# Patient Record
Sex: Female | Born: 1965
Health system: Southern US, Community
[De-identification: ages and names within clinical notes are randomized; demographics above are authoritative.]

## PROBLEM LIST (undated history)

## (undated) DIAGNOSIS — F32A Depression, unspecified: Secondary | ICD-10-CM

## (undated) DIAGNOSIS — T7840XA Allergy, unspecified, initial encounter: Secondary | ICD-10-CM

## (undated) DIAGNOSIS — Z1371 Encounter for nonprocreative screening for genetic disease carrier status: Secondary | ICD-10-CM

## (undated) DIAGNOSIS — K219 Gastro-esophageal reflux disease without esophagitis: Secondary | ICD-10-CM

## (undated) DIAGNOSIS — F329 Major depressive disorder, single episode, unspecified: Secondary | ICD-10-CM

## (undated) DIAGNOSIS — M4310 Spondylolisthesis, site unspecified: Secondary | ICD-10-CM

## (undated) DIAGNOSIS — F419 Anxiety disorder, unspecified: Secondary | ICD-10-CM

## (undated) HISTORY — PX: CHOLECYSTECTOMY: SHX55

## (undated) HISTORY — DX: Spondylolisthesis, site unspecified: M43.10

## (undated) HISTORY — PX: ABDOMINAL HYSTERECTOMY: SHX81

## (undated) HISTORY — DX: Depression, unspecified: F32.A

## (undated) HISTORY — DX: Major depressive disorder, single episode, unspecified: F32.9

## (undated) HISTORY — DX: Gastro-esophageal reflux disease without esophagitis: K21.9

## (undated) HISTORY — PX: LAPAROSCOPIC GASTRIC SLEEVE RESECTION: SHX5895

## (undated) HISTORY — DX: Encounter for nonprocreative screening for genetic disease carrier status: Z13.71

## (undated) HISTORY — DX: Allergy, unspecified, initial encounter: T78.40XA

---

## 2014-10-10 DIAGNOSIS — F419 Anxiety disorder, unspecified: Secondary | ICD-10-CM | POA: Insufficient documentation

## 2014-10-10 DIAGNOSIS — Z803 Family history of malignant neoplasm of breast: Secondary | ICD-10-CM | POA: Insufficient documentation

## 2014-10-10 DIAGNOSIS — Z90710 Acquired absence of both cervix and uterus: Secondary | ICD-10-CM | POA: Insufficient documentation

## 2014-10-10 DIAGNOSIS — J3081 Allergic rhinitis due to animal (cat) (dog) hair and dander: Secondary | ICD-10-CM | POA: Insufficient documentation

## 2015-10-27 ENCOUNTER — Ambulatory Visit
Admission: EM | Admit: 2015-10-27 | Discharge: 2015-10-27 | Disposition: A | Discharge status: Home / Self Care | Payer: BLUE CROSS/BLUE SHIELD | Attending: Emergency Medicine | Admitting: Emergency Medicine

## 2015-10-27 DIAGNOSIS — R1010 Upper abdominal pain, unspecified: Secondary | ICD-10-CM

## 2015-10-27 DIAGNOSIS — R079 Chest pain, unspecified: Secondary | ICD-10-CM | POA: Diagnosis present

## 2015-10-27 HISTORY — DX: Anxiety disorder, unspecified: F41.9

## 2015-10-27 HISTORY — DX: Hemochromatosis, unspecified: E83.119

## 2015-10-27 MED ORDER — ACETAMINOPHEN 500 MG PO TABS
1000.0000 mg | ORAL_TABLET | Freq: Once | ORAL | Status: AC
  Administered 2015-10-27: 1000 mg via ORAL

## 2015-10-27 NOTE — Discharge Instructions (Signed)
Go to the [facility] emergency room to rule out serious causes of your abdominal pain.

## 2015-10-27 NOTE — ED Provider Notes (Signed)
HPI  SUBJECTIVE:   Morgan Hobbs is a 49 y.o. female who presents with subxiphoid, epigastric pain for the past 5 days. She reports it as constant, sharp, tenderness, states that it feels as if "someone is punching me". She states that the pain radiates to her back and into her left shoulder. She states that the pain is worse with eating, drinking, belching, deep inspiration, palpation, movement. It is slightly better with lying on her side. She has tried Gas-X, Tums, omeprazole. She reports decreased by mouth intake due to the pain. She had a liver biopsy for hemochromatosis 4 days ago, but was having the abdominal pain prior to that. Saturday she started having diarrhea several episodes today, after eating. She reports sensation of abdominal distention, but denies nausea, vomiting, fevers, coughing, wheezing, shortness of breath. She also reports substernal intermittent, seconds long nonradiating chest pain that she describes as having a "panic attack" for several days. She has had similar chest pain before. She denies diaphoresis, palpitations, presyncope, syncope. There is no positional, exertional, pleuritic component to this chest pain. Past medical history status post cholecystectomy, gastric sleeve for weight loss, hiatal hernia repair, hysterectomy,  hemochromatosis, GERD. She denies history of pancreatitis, alcohol abuse, peptic ulcer disease, gastric ulcers, atrial fibrillation, mesenteric ischemia, MI, coronary artery disease, hypercholesterolemia, diabetes, hypertension. Family history negative for early MI.   Past Medical History  Diagnosis Date  . Hemochromatosis   . Anxiety     Past Surgical History  Procedure Laterality Date  . Cholecystectomy    . Abdominal hysterectomy    . Laparoscopic gastric sleeve resection      Family History  Problem Relation Age of Onset  . Cancer Mother   . Lupus Father   . Cancer Sister     Social History  Substance Use Topics  . Smoking  status: Never Smoker   . Smokeless tobacco: None  . Alcohol Use: Yes     Comment: rarely    No current facility-administered medications for this encounter.  Current outpatient prescriptions:  .  acetaminophen (TYLENOL) 650 MG CR tablet, Take 650 mg by mouth every 8 (eight) hours as needed for pain., Disp: , Rfl:  .  FLUoxetine (PROZAC) 20 MG capsule, Take 20 mg by mouth daily., Disp: , Rfl:  .  gabapentin (NEURONTIN) 600 MG tablet, Take 600 mg by mouth 1 day or 1 dose., Disp: , Rfl:  .  loratadine (CLARITIN) 10 MG tablet, Take 10 mg by mouth daily., Disp: , Rfl:  .  Melatonin 3 MG TABS, Take by mouth., Disp: , Rfl:  .  Multiple Vitamin (MULTIVITAMIN) capsule, Take 1 capsule by mouth daily., Disp: , Rfl:   Allergies  Allergen Reactions  . Codeine Shortness Of Breath  . Decadron [Dexamethasone] Other (See Comments)    Hallucinations  . Erythromycin Hives  . Penicillins Rash     ROS  As noted in HPI.   Physical Exam  BP 96/62 mmHg  Pulse 72  Temp(Src) 98.7 F (37.1 C) (Tympanic)  Resp 16  Ht 5\' 8"  (1.727 m)  Wt 201 lb (91.173 kg)  BMI 30.57 kg/m2  SpO2 100%  Constitutional: Well developed, well nourished, appears uncomfortable Eyes: PERRL, EOMI, conjunctiva normal bilaterally HENT: Normocephalic, atraumatic,mucus membranes moist Respiratory: Clear to auscultation bilaterally, no rales, no wheezing, no rhonchi Cardiovascular: Normal rate and rhythm, no murmurs, no gallops, no rubs GI: Soft, midline gastric/subxiphoid bruising with healing surgical incision. + upper abdominal tenderness including left upper quadrant, right upper quadrant.  no guarding. + rebound.  nondistended, normal bowel sounds.  Back: no CVAT skin: No rash, skin intact Musculoskeletal: No edema, no tenderness, no deformities Neurologic: Alert & oriented x 3, CN II-XII grossly intact, no motor deficits, sensation grossly intact Psychiatric: Speech and behavior appropriate   ED  Course   Medications  acetaminophen (TYLENOL) tablet 1,000 mg (1,000 mg Oral Given 10/27/15 1650)    Orders Placed This Encounter  Procedures  . ED EKG    Standing Status: Standing     Number of Occurrences: 1     Standing Expiration Date:     Order Specific Question:  Reason for Exam    Answer:  Chest Pain  . EKG 12-Lead    Standing Status: Standing     Number of Occurrences: 1     Standing Expiration Date:    No results found for this or any previous visit (from the past 24 hour(s)). No results found.  ED Clinical Impression  Pain of upper abdomen  ED Assessment/Plan  Patient declined nausea medication. Requested Tylenol.   EKG normal sinus rhythm, rate 63 normal axis, normal intervals. No hypertrophy. No ST-T wave changes. No previous EKG for comparision.   Expected right upper quadrant tenderness given recent biopsy, but has significant subxiphoid and left upper quadrant tenderness.  transferring to the ER to rule out perforation or other abdominal emergency. Vitals are acceptable. EKG is normal. feel the patient is safe to go via private vehicle. Patient has decided to go to [facility] since she has received most of her care there. Discussed the rationale for transfer. Patient agrees with plan.  *This clinic note was created using Dragon dictation software. Therefore, there may be occasional mistakes despite careful proofreading.  ?  Domenick Gong, MD 10/28/15 1236

## 2015-10-27 NOTE — ED Notes (Signed)
States started last Thursday with epigastric pain, headache and diarrhea started Saturday. Recently Dx with hemachromatosis(sp?) and [facility]. She states she told them her symptoms and they were not concerned. Continues with epigastric pain and has not called PMD

## 2017-02-08 DIAGNOSIS — Z9884 Bariatric surgery status: Secondary | ICD-10-CM | POA: Insufficient documentation

## 2017-02-09 DIAGNOSIS — E669 Obesity, unspecified: Secondary | ICD-10-CM | POA: Insufficient documentation

## 2017-08-31 ENCOUNTER — Ambulatory Visit
Admission: EM | Admit: 2017-08-31 | Discharge: 2017-08-31 | Disposition: A | Discharge status: Home / Self Care | Payer: BLUE CROSS/BLUE SHIELD | Attending: Family Medicine | Admitting: Family Medicine

## 2017-08-31 ENCOUNTER — Encounter: Payer: Self-pay | Admitting: *Deleted

## 2017-08-31 DIAGNOSIS — R5383 Other fatigue: Secondary | ICD-10-CM | POA: Diagnosis not present

## 2017-08-31 DIAGNOSIS — J029 Acute pharyngitis, unspecified: Secondary | ICD-10-CM

## 2017-08-31 DIAGNOSIS — R05 Cough: Secondary | ICD-10-CM

## 2017-08-31 DIAGNOSIS — J111 Influenza due to unidentified influenza virus with other respiratory manifestations: Secondary | ICD-10-CM

## 2017-08-31 DIAGNOSIS — R51 Headache: Secondary | ICD-10-CM | POA: Diagnosis not present

## 2017-08-31 DIAGNOSIS — R69 Illness, unspecified: Secondary | ICD-10-CM

## 2017-08-31 LAB — RAPID STREP SCREEN (MED CTR MEBANE ONLY): STREPTOCOCCUS, GROUP A SCREEN (DIRECT): NEGATIVE

## 2017-08-31 MED ORDER — OSELTAMIVIR PHOSPHATE 75 MG PO CAPS: 75.0000 mg | ORAL_CAPSULE | Freq: Two times a day (BID) | ORAL | 0 refills | Status: DC

## 2017-08-31 MED ORDER — KETOROLAC TROMETHAMINE 60 MG/2ML IM SOLN
60.0000 mg | Freq: Once | INTRAMUSCULAR | Status: AC
  Administered 2017-08-31: 60 mg via INTRAMUSCULAR

## 2017-08-31 NOTE — Discharge Instructions (Signed)
Rest, fluids, over the counter tylenol as needed °

## 2017-08-31 NOTE — ED Provider Notes (Signed)
[facility]    CSN: [id number] Arrival date & time: 08/31/17  1619     History   Chief Complaint Chief Complaint  Patient presents with  . Cough  . Generalized Body Aches  . Headache  . Chills  . Nausea  . Nasal Congestion    HPI Morgan Hobbs is a 51 y.o. female.   The history is provided by the patient.  Cough  Associated symptoms: fever, headaches, myalgias, rhinorrhea and sore throat   Associated symptoms: no wheezing   Headache  Associated symptoms: congestion, cough, fatigue, fever, myalgias, sore throat and URI   URI  Presenting symptoms: congestion, cough, fatigue, fever, rhinorrhea and sore throat   Severity:  Moderate Onset quality:  Sudden Duration:  1 day Timing:  Constant Progression:  Worsening Chronicity:  New Relieved by:  Nothing Ineffective treatments:  OTC medications Associated symptoms: headaches and myalgias   Associated symptoms: no sinus pain and no wheezing   Risk factors: not elderly, no chronic cardiac disease, no chronic kidney disease, no chronic respiratory disease, no diabetes mellitus, no immunosuppression, no recent illness and no recent travel     Past Medical History:  Diagnosis Date  . Anxiety   . Hemochromatosis     There are no active problems to display for this patient.   Past Surgical History:  Procedure Laterality Date  . ABDOMINAL HYSTERECTOMY    . CHOLECYSTECTOMY    . LAPAROSCOPIC GASTRIC SLEEVE RESECTION      OB History    No data available       Home Medications    Prior to Admission medications   Medication Sig Start Date End Date Taking? Authorizing Provider  acetaminophen (TYLENOL) 650 MG CR tablet Take 650 mg by mouth every 8 (eight) hours as needed for pain.   Yes [provider]  dicyclomine (BENTYL) 10 MG capsule Take 10 mg by mouth 4 (four) times daily -  before meals and at bedtime.   Yes [provider]  FLUoxetine (PROZAC) 20 MG capsule Take 20 mg by  mouth daily.   Yes [provider]  gabapentin (NEURONTIN) 600 MG tablet Take 600 mg by mouth 1 day or 1 dose.   Yes [provider]  loratadine (CLARITIN) 10 MG tablet Take 10 mg by mouth daily.   Yes [provider]  Melatonin 3 MG TABS Take by mouth.   Yes [provider]  methocarbamol (ROBAXIN) 500 MG tablet Take 500 mg by mouth 4 (four) times daily.   Yes [provider]  Multiple Vitamin (MULTIVITAMIN) capsule Take 1 capsule by mouth daily.   Yes [provider]  oseltamivir (TAMIFLU) 75 MG capsule Take 1 capsule (75 mg total) by mouth 2 (two) times daily. 08/31/17   Payton Mccallumonty, Byrd Rushlow, MD    Family History Family History  Problem Relation Age of Onset  . Cancer Mother   . Lupus Father   . Cancer Sister     Social History Social History  Substance Use Topics  . Smoking status: Never Smoker  . Smokeless tobacco: Never Used  . Alcohol use Yes     Comment: rarely     Allergies   Codeine; Decadron [dexamethasone]; Erythromycin; and Penicillins   Review of Systems Review of Systems  Constitutional: Positive for fatigue and fever.  HENT: Positive for congestion, rhinorrhea and sore throat. Negative for sinus pain.   Respiratory: Positive for cough. Negative for wheezing.   Musculoskeletal: Positive for myalgias.  Neurological: Positive  for headaches.     Physical Exam Triage Vital Signs ED Triage Vitals  Enc Vitals Group     BP 08/31/17 1642 (!) 109/57     Pulse Rate 08/31/17 1642 86     Resp 08/31/17 1642 16     Temp 08/31/17 1642 98.9 F (37.2 C)     Temp Source 08/31/17 1642 Oral     SpO2 08/31/17 1642 100 %     Weight 08/31/17 1645 230 lb (104.3 kg)     Height 08/31/17 1645 5\' 8"  (1.727 m)     Head Circumference --      Peak Flow --      Pain Score --      Pain Loc --      Pain Edu? --      Excl. in GC? --    No data found.   Updated Vital Signs BP (!) 109/57 (BP Location: Left Arm)   Pulse 86    Temp 98.9 F (37.2 C) (Oral)   Resp 16   Ht 5\' 8"  (1.727 m)   Wt 230 lb (104.3 kg)   SpO2 100%   BMI 34.97 kg/m   Visual Acuity Right Eye Distance:   Left Eye Distance:   Bilateral Distance:    Right Eye Near:   Left Eye Near:    Bilateral Near:     Physical Exam  Constitutional: She appears well-developed and well-nourished. No distress.  HENT:  Head: Normocephalic and atraumatic.  Right Ear: Tympanic membrane, external ear and ear canal normal.  Left Ear: Tympanic membrane, external ear and ear canal normal.  Nose: No mucosal edema, rhinorrhea, nose lacerations, sinus tenderness, nasal deformity, septal deviation or nasal septal hematoma. No epistaxis.  No foreign bodies. Right sinus exhibits no maxillary sinus tenderness and no frontal sinus tenderness. Left sinus exhibits no maxillary sinus tenderness and no frontal sinus tenderness.  Mouth/Throat: Uvula is midline, oropharynx is clear and moist and mucous membranes are normal. No oropharyngeal exudate.  Eyes: Pupils are equal, round, and reactive to light. Conjunctivae and EOM are normal. Right eye exhibits no discharge. Left eye exhibits no discharge. No scleral icterus.  Neck: Normal range of motion. Neck supple. No thyromegaly present.  Cardiovascular: Normal rate, regular rhythm and normal heart sounds.   Pulmonary/Chest: Effort normal and breath sounds normal. No respiratory distress. She has no wheezes. She has no rales.  Musculoskeletal: She exhibits no edema or deformity.  Lymphadenopathy:    She has no cervical adenopathy.  Skin: She is not diaphoretic.  Nursing note and vitals reviewed.    UC Treatments / Results  Labs (all labs ordered are listed, but only abnormal results are displayed) Labs Reviewed  RAPID STREP SCREEN (NOT AT [facility])  CULTURE, GROUP A STREP [facility])    EKG  EKG Interpretation None       Radiology No results found.  Procedures Procedures (including critical care  time)  Medications Ordered in UC Medications  ketorolac (TORADOL) injection 60 mg (not administered)     Initial Impression / Assessment and Plan / UC Course  I have reviewed the triage vital signs and the nursing notes.  Pertinent labs & imaging results that were available during my care of the patient were reviewed by me and considered in my medical decision making (see chart for details).       Final Clinical Impressions(s) / UC Diagnoses   Final diagnoses:  Influenza-like illness    New Prescriptions New Prescriptions  OSELTAMIVIR (TAMIFLU) 75 MG CAPSULE    Take 1 capsule (75 mg total) by mouth 2 (two) times daily.   1. Lab results and diagnosis reviewed with patient 2. Patient given toradol IM as per orders for headache and joint pains 3.  rx as per orders above; reviewed possible side effects, interactions, risks and benefits  3. Recommend supportive treatment with rest, fluids, otc analgesics prn 4. Follow-up prn if symptoms worsen or don't improve Controlled Substance Prescriptions [place] Controlled Substance Registry consulted? Not Applicable   Payton Mccallum, MD 08/31/17 [phone]

## 2017-08-31 NOTE — ED Triage Notes (Signed)
Non-productive cough, head congestion, runny nose, headache, body aches, chills since yesterday. Recent contact with family member dx with strep.

## 2017-09-03 LAB — CULTURE, GROUP A STREP (THRC)

## 2017-09-14 DIAGNOSIS — I951 Orthostatic hypotension: Secondary | ICD-10-CM | POA: Insufficient documentation

## 2018-04-21 ENCOUNTER — Encounter: Payer: Self-pay | Admitting: Nurse Practitioner

## 2018-04-21 ENCOUNTER — Ambulatory Visit (INDEPENDENT_AMBULATORY_CARE_PROVIDER_SITE_OTHER): Payer: 59 | Admitting: Nurse Practitioner

## 2018-04-21 ENCOUNTER — Other Ambulatory Visit: Payer: Self-pay

## 2018-04-21 VITALS — BP 100/49 | HR 74 | Temp 98.1°F | Ht 68.0 in | Wt 249.8 lb

## 2018-04-21 DIAGNOSIS — Z7689 Persons encountering health services in other specified circumstances: Secondary | ICD-10-CM | POA: Diagnosis not present

## 2018-04-21 DIAGNOSIS — R42 Dizziness and giddiness: Secondary | ICD-10-CM | POA: Diagnosis not present

## 2018-04-21 DIAGNOSIS — K219 Gastro-esophageal reflux disease without esophagitis: Secondary | ICD-10-CM

## 2018-04-21 DIAGNOSIS — M5441 Lumbago with sciatica, right side: Secondary | ICD-10-CM

## 2018-04-21 DIAGNOSIS — F325 Major depressive disorder, single episode, in full remission: Secondary | ICD-10-CM

## 2018-04-21 DIAGNOSIS — G8929 Other chronic pain: Secondary | ICD-10-CM | POA: Diagnosis not present

## 2018-04-21 DIAGNOSIS — I950 Idiopathic hypotension: Secondary | ICD-10-CM

## 2018-04-21 MED ORDER — OMEPRAZOLE 20 MG PO CPDR: DELAYED_RELEASE_CAPSULE | ORAL | 1 refills | Status: DC

## 2018-04-21 MED ORDER — GABAPENTIN 300 MG PO CAPS: 300.0000 mg | ORAL_CAPSULE | Freq: Every day | ORAL | 3 refills | Status: DC

## 2018-04-21 MED ORDER — NALTREXONE HCL 50 MG PO TABS: ORAL_TABLET | ORAL | 3 refills | Status: DC

## 2018-04-21 MED ORDER — FAMOTIDINE 20 MG PO TABS
20.0000 mg | ORAL_TABLET | Freq: Two times a day (BID) | ORAL | Status: DC | PRN
Start: 2018-04-21 — End: 2018-05-22

## 2018-04-21 MED ORDER — FLUOXETINE HCL 20 MG PO CAPS: 20.0000 mg | ORAL_CAPSULE | Freq: Every day | ORAL | 5 refills | Status: DC

## 2018-04-21 MED ORDER — BUPROPION HCL ER (SR) 200 MG PO TB12: ORAL_TABLET | ORAL | 5 refills | Status: DC

## 2018-04-21 MED ORDER — MIDODRINE HCL 5 MG PO TABS: 5.0000 mg | ORAL_TABLET | Freq: Three times a day (TID) | ORAL | 1 refills | Status: AC

## 2018-04-21 MED ORDER — METHOCARBAMOL 500 MG PO TABS: 500.0000 mg | ORAL_TABLET | Freq: Three times a day (TID) | ORAL | 5 refills | Status: DC | PRN

## 2018-04-21 NOTE — Patient Instructions (Addendum)
Morgan SellarFelicia Hobbs,   Thank you for coming in to clinic today.  1. START midodrine 5 mg three times daily with meal.  - We will repeat cardiology consult if you continue having syncope and dizziness with blackouts.  2. Resume contrave combo of Wellbutrin/naloxone twice daily for next 4-6 weeks.  If not keeping stable weight, we will try alternative medication for weight loss.  Please schedule a follow-up appointment with Wilhelmina McardleLauren Imanol Bihl, AGNP. Return in about 3 weeks (around 05/12/2018) for annual physical.  If you have any other questions or concerns, please feel free to call the clinic or send a message through MyChart. You may also schedule an earlier appointment if necessary.  You will receive a survey after today's visit either digitally by e-mail or paper by [employer] mail. Your experiences and feedback matter to us.  Please respond so we know how we are doing as we provide care for you.   Wilhelmina McardleLauren Eryanna Regal, DNP, AGNP-BC Adult Gerontology Nurse Practitioner [facility]

## 2018-04-21 NOTE — Progress Notes (Signed)
Subjective:    Patient ID: Morgan Hobbs, female    DOB: 1966/08/08, 52 y.o.   MRN: [id number]  Morgan Hobbs is a 52 y.o. female presenting on 04/21/2018 for Establish Care (need medication refill) and Dizziness (chronic intermittent dizziness w/ head pressure. Pt reports she faint x 2 time. )   HPI Establish Care New Provider Pt last seen by PCP [facility].  Obtain records from [place].  Dizziness   Pt reports syncopal and pre-syncopal events several times in last several years that has worsened over last several weeks with 2 syncopal events in last 2-3 weeks. - Pt notes a pattern chronically of dizziness with motion after driving /riding in car.  Specifically, after she would walk away from her car.  She notes this as having blackening of her vision, dizziness, and "whoosh, and squeezing pressure with intense headache that resolves several seconds to 1-2 minutes after onset.  It was so bad about 1 year ago that she fell.  She notes the sensation occurred while driving a few weeks ago, which had not happened before or since.  Had significant head pressure yesterday am with SBP at 114 about 2 minutes after the event, usual BP 90/60 - Has had holter monitor about 10 years ago in [place] and was negative.  - Has also seen Neurology at [place] (Dr. Pearletha Forge without  Answers to cause of dizziness. - no other associated symptoms such as tachycardia, tachypnea, or blurry vision.  She denies any thunderclap headache.  Has had negative brain MRI, notable for absence of aneurysm. - Pt reports she has never taken midodrine   Morbid Obesity Pt is currently taking Contrave with separate bupropion and naltrexone prescriptions.  SHe notes she has continued to gain weight while taking, but is only taking once daily.  She is also participating in [employer] 52 week group weight loss program.  In 3 weeks has gained 2 lbs from 241 - 243 lbs on today's weight.   She has history of gastrick sleeve  surgery in 2013.  Past Medical History:  Diagnosis Date  . Allergy   . Anxiety   . BRCA negative   . Depression   . GERD (gastroesophageal reflux disease)   . Hemochromatosis   . Spondylolisthesis    lumbar   Past Surgical History:  Procedure Laterality Date  . ABDOMINAL HYSTERECTOMY    . CESAREAN SECTION    . CHOLECYSTECTOMY    . LAPAROSCOPIC GASTRIC SLEEVE RESECTION     Social History   Socioeconomic History  . Marital status: Married    Spouse name: Not on file  . Number of children: Not on file  . Years of education: Not on file  . Highest education level: Associate degree: occupational, Hotel manager, or vocational program  Occupational History  . Occupation: Equities trader  Social Needs  . Financial resource strain: Not hard at all  . Food insecurity:    Worry: Never true    Inability: Never true  . Transportation needs:    Medical: No    Non-medical: No  Tobacco Use  . Smoking status: Never Smoker  . Smokeless tobacco: Never Used  Substance and Sexual Activity  . Alcohol use: Yes    Comment: rarely (<5 drinks per year)  . Drug use: Never  . Sexual activity: Not Currently  Lifestyle  . Physical activity:    Days per week: 3 days    Minutes per session: 20 min  . Stress: Not on file  Relationships  . Social connections:    Talks on phone: Not on file    Gets together: Not on file    Attends religious service: Not on file    Active member of club or organization: Not on file    Attends meetings of clubs or organizations: Not on file    Relationship status: Not on file  . Intimate partner violence:    Fear of current or ex partner: Not on file    Emotionally abused: Not on file    Physically abused: Not on file    Forced sexual activity: Not on file  Other Topics Concern  . Not on file  Social History Narrative  . Not on file   Family History  Problem Relation Age of Onset  . Cancer Mother   . Lupus Father   . Nephrotic syndrome Father   .  Cancer Sister   . BRCA 1/2 Sister   . Breast cancer Sister   . Stroke Maternal Grandmother   . Atrial fibrillation Maternal Grandmother   . Lung cancer Paternal Grandmother    Current Outpatient Medications on File Prior to Visit  Medication Sig  . acetaminophen (TYLENOL) 650 MG CR tablet Take 650 mg by mouth every 8 (eight) hours as needed for pain.  . BD PEN NEEDLE NANO U/F 32G X 4 MM MISC U UTD ONCE D  . calcium citrate-vitamin D (CITRACAL+D) 315-200 MG-UNIT tablet Take by mouth.  . dicyclomine (BENTYL) 10 MG capsule Take 10 mg by mouth 4 (four) times daily -  before meals and at bedtime.  Marland Kitchen loratadine (CLARITIN) 10 MG tablet Take 10 mg by mouth daily.  . Melatonin 3 MG TABS Take by mouth.  . Multiple Vitamin (MULTIVITAMIN) capsule Take 1 capsule by mouth daily.  Marland Kitchen oseltamivir (TAMIFLU) 75 MG capsule Take 1 capsule (75 mg total) by mouth 2 (two) times daily. (Patient not taking: Reported on 04/21/2018)   No current facility-administered medications on file prior to visit.     Review of Systems Per HPI unless specifically indicated above     Objective:    BP (!) 100/49 (BP Location: Right Arm, Patient Position: Sitting, Cuff Size: Large)   Pulse 74   Temp 98.1 F (36.7 C) (Oral)   Ht _0  (1.727 m)   Wt 249 lb 12.8 oz (113.3 kg)   BMI 37.98 kg/m   Wt Readings from Last 3 Encounters:  04/21/18 249 lb 12.8 oz (113.3 kg)  08/31/17 230 lb (104.3 kg)  10/27/15 201 lb (91.2 kg)    Physical Exam  Constitutional: She is oriented to person, place, and time. She appears well-developed and well-nourished. No distress.  HENT:  Head: Normocephalic and atraumatic.  Neck: Normal range of motion. Neck supple. Carotid bruit is not present.  Cardiovascular: Normal rate, regular rhythm, S1 normal, S2 normal, normal heart sounds and intact distal pulses.  Pulmonary/Chest: Effort normal and breath sounds normal. No respiratory distress.  Musculoskeletal: She exhibits no edema (pedal).    Neurological: She is alert and oriented to person, place, and time. She displays normal reflexes. No cranial nerve deficit or sensory deficit. She exhibits normal muscle tone. Coordination normal.  Negative forward head tilt.  Negative dix-hallpike  Skin: Skin is warm and dry. Capillary refill takes less than 2 seconds.  Psychiatric: She has a normal mood and affect. Her behavior is normal. Judgment and thought content normal.  Vitals reviewed.    Results for orders placed or performed during the  hospital encounter of 08/31/17  Rapid strep screen  Result Value Ref Range   Streptococcus, Group A Screen (Direct) NEGATIVE NEGATIVE  Culture, group A strep  Result Value Ref Range   Specimen Description THROAT    Special Requests NONE Reflexed from [id number]    Culture      NO GROUP A STREP (S.PYOGENES) ISOLATED Performed at [facility], [place] [address] [place], [place] [address]    Report Status 09/03/2017 FINAL       Assessment & Plan:   Problem List Items Addressed This Visit    None    Visit Diagnoses    Dizziness    -  Primary Dizziness of unknown etiology.  Possible association to idiopathic hypotension.  No prior cardiac or neurological etiology.   - START midodrine 5 mg tid wc and hs. - Followup 3 weeks - Refer to cardiology again if continuing to have syncope and dizziness.  Followup with log of symptoms and occurrences.   Relevant Medications   midodrine (PROAMATINE) 5 MG tablet   Encounter to establish care     Previous PCP was in [place].  Pt has had specialist care in [place].  Records in [facility] are reviewed.  Past medical, family, and surgical history reviewed w/ pt.     Idiopathic hypotension     See Dizziness above   Relevant Medications   midodrine (PROAMATINE) 5 MG tablet   Gastroesophageal reflux disease, esophagitis presence not specified     The current medical regimen is effective;  continue present plan and medications.    Relevant Medications    omeprazole (PRILOSEC) 20 MG capsule   famotidine (PEPCID) 20 MG tablet   Morbid obesity (HCC)     Worsening despite use of medication and good lifestyle per pt report.  Continue medications at full prescribed dose.  Review in 3-6 weeks.  Consider change to Belviq or Saxenda in future.   Relevant Medications   naltrexone (DEPADE) 50 MG tablet   buPROPion (WELLBUTRIN SR) 200 MG 12 hr tablet   Chronic midline low back pain with right-sided sciatica     The current medical regimen is effective;  continue present plan and medications.     Relevant Medications   buPROPion (WELLBUTRIN SR) 200 MG 12 hr tablet   FLUoxetine (PROZAC) 20 MG capsule   gabapentin (NEURONTIN) 300 MG capsule   methocarbamol (ROBAXIN) 500 MG tablet   Major depressive disorder with single episode, in full remission [facility])     The current medical regimen is effective;  continue present plan and medications.     Relevant Medications   buPROPion (WELLBUTRIN SR) 200 MG 12 hr tablet   FLUoxetine (PROZAC) 20 MG capsule      Meds ordered this encounter  Medications  . midodrine (PROAMATINE) 5 MG tablet    Sig: Take 1 tablet (5 mg total) by mouth 3 (three) times daily with meals.    Dispense:  90 tablet    Refill:  1    Order Specific Question:   Supervising Provider    Answer:   Olin Hauser [[id number]]  . omeprazole (PRILOSEC) 20 MG capsule    Sig: Take 1 cap twice daily for 7 days.  Then, take 1 cap daily x 7 days.  Then 1 cap every other day x 7 days and stop.    Dispense:  60 capsule    Refill:  1    Order Specific Question:   Supervising Provider  Answer:   Olin Hauser [[id number]]  . famotidine (PEPCID) 20 MG tablet    Sig: Take 1 tablet (20 mg total) by mouth 2 (two) times daily as needed for heartburn or indigestion.    Order Specific Question:   Supervising Provider    Answer:   Olin Hauser [[id number]]  . naltrexone (DEPADE) 50 MG tablet    Sig: Take 1/2 tablet twice daily with  Bupropion. Taper as discussed.    Dispense:  30 tablet    Refill:  3    Order Specific Question:   Supervising Provider    Answer:   Olin Hauser [[id number]]  . buPROPion (WELLBUTRIN SR) 200 MG 12 hr tablet    Sig: Take 1 tablet twice daily with naltrexone.    Dispense:  60 tablet    Refill:  5    Order Specific Question:   Supervising Provider    Answer:   Olin Hauser [[id number]]  . FLUoxetine (PROZAC) 20 MG capsule    Sig: Take 1 capsule (20 mg total) by mouth daily.    Dispense:  30 capsule    Refill:  5    Order Specific Question:   Supervising Provider    Answer:   Olin Hauser [[id number]]  . gabapentin (NEURONTIN) 300 MG capsule    Sig: Take 1 capsule (300 mg total) by mouth at bedtime.    Dispense:  90 capsule    Refill:  3    Order Specific Question:   Supervising Provider    Answer:   Olin Hauser [[id number]]  . methocarbamol (ROBAXIN) 500 MG tablet    Sig: Take 1 tablet (500 mg total) by mouth 3 (three) times daily as needed for muscle spasms.    Dispense:  30 tablet    Refill:  5    Do NOT fill today    Order Specific Question:   Supervising Provider    Answer:   Olin Hauser [[id number]]     Follow up plan: Return in about 3 weeks (around 05/12/2018) for annual physical.  Cassell Smiles, DNP, AGPCNP-BC Adult Gerontology Primary Care Nurse Practitioner [facility] 04/26/2018, 1:30 PM

## 2018-04-26 ENCOUNTER — Encounter: Payer: Self-pay | Admitting: Nurse Practitioner

## 2018-05-22 ENCOUNTER — Other Ambulatory Visit: Payer: Self-pay

## 2018-05-22 ENCOUNTER — Ambulatory Visit (INDEPENDENT_AMBULATORY_CARE_PROVIDER_SITE_OTHER): Payer: 59 | Admitting: Nurse Practitioner

## 2018-05-22 ENCOUNTER — Encounter: Payer: Self-pay | Admitting: Nurse Practitioner

## 2018-05-22 ENCOUNTER — Other Ambulatory Visit: Payer: Self-pay | Admitting: Nurse Practitioner

## 2018-05-22 VITALS — BP 104/45 | HR 82 | Temp 98.0°F | Ht 68.0 in | Wt 248.0 lb

## 2018-05-22 DIAGNOSIS — Z23 Encounter for immunization: Secondary | ICD-10-CM | POA: Diagnosis not present

## 2018-05-22 DIAGNOSIS — Z9884 Bariatric surgery status: Secondary | ICD-10-CM

## 2018-05-22 DIAGNOSIS — Z0001 Encounter for general adult medical examination with abnormal findings: Secondary | ICD-10-CM

## 2018-05-22 DIAGNOSIS — Z13820 Encounter for screening for osteoporosis: Secondary | ICD-10-CM

## 2018-05-22 DIAGNOSIS — N951 Menopausal and female climacteric states: Secondary | ICD-10-CM | POA: Diagnosis not present

## 2018-05-22 DIAGNOSIS — Z0000 Encounter for general adult medical examination without abnormal findings: Secondary | ICD-10-CM

## 2018-05-22 DIAGNOSIS — K219 Gastro-esophageal reflux disease without esophagitis: Secondary | ICD-10-CM | POA: Diagnosis not present

## 2018-05-22 DIAGNOSIS — M1990 Unspecified osteoarthritis, unspecified site: Secondary | ICD-10-CM | POA: Insufficient documentation

## 2018-05-22 DIAGNOSIS — Z1231 Encounter for screening mammogram for malignant neoplasm of breast: Secondary | ICD-10-CM

## 2018-05-22 MED ORDER — OMEPRAZOLE 20 MG PO CPDR: 20.0000 mg | DELAYED_RELEASE_CAPSULE | Freq: Every day | ORAL | 1 refills | Status: DC

## 2018-05-22 MED ORDER — OMEPRAZOLE 20 MG PO CPDR: 20.0000 mg | DELAYED_RELEASE_CAPSULE | Freq: Every day | ORAL | 1 refills | Status: AC

## 2018-05-22 NOTE — Progress Notes (Signed)
Subjective:    Patient ID: Morgan Hobbs, female    DOB: [birth year], 52 y.o.   MRN: [id number]  Morgan Hobbs is a 52 y.o. female presenting on 05/22/2018 for Annual Exam   HPI  Annual Physical Exam Patient has been feeling well.  Sleeps 7-8 hours per night interrupted x 2 for restroom.   - They have acute concerns today with nausea, bloating, full even without meals.  - She continues to gain weight despite prior lab band surgery and contrave medication.  Contrave is helping control food cravings. - She would also like to have lab testing performed to determine postmenopausal status and have iron studies done for hemochromatosis.  HEALTH MAINTENANCE: Weight/BMI: increasing Physical activity: 2 hours per week total Diet: generally healthy, currently limited to very small meals Seatbelt: always, except in back seat Sunscreen: regularly PAP: last were normal - patient s/p hysterectomy Mammogram: yearly DEXA: due Colon Cancer Screen: done 2018, repeat 10 years HIV: neg 2016 Optometry: regular Dentistry: regular  VACCINES: Tetanus: In the last 5 years Influenza: regularly Shingles: desires, wants more info  Past Medical History:  Diagnosis Date  . Allergy   . Anxiety   . BRCA negative   . Depression   . GERD (gastroesophageal reflux disease)   . Hemochromatosis   . Spondylolisthesis    lumbar   Past Surgical History:  Procedure Laterality Date  . ABDOMINAL HYSTERECTOMY    . CESAREAN SECTION    . CHOLECYSTECTOMY    . LAPAROSCOPIC GASTRIC SLEEVE RESECTION     Social History   Socioeconomic History  . Marital status: Married    Spouse name: Not on file  . Number of children: Not on file  . Years of education: Not on file  . Highest education level: Associate degree: occupational, Hotel manager, or vocational program  Occupational History  . Occupation: Equities trader  Social Needs  . Financial resource strain: Not hard at all  . Food insecurity:    Worry:  Never true    Inability: Never true  . Transportation needs:    Medical: No    Non-medical: No  Tobacco Use  . Smoking status: Never Smoker  . Smokeless tobacco: Never Used  Substance and Sexual Activity  . Alcohol use: Yes    Comment: rarely (<5 drinks per year)  . Drug use: Never  . Sexual activity: Not Currently  Lifestyle  . Physical activity:    Days per week: 3 days    Minutes per session: 20 min  . Stress: Not on file  Relationships  . Social connections:    Talks on phone: Not on file    Gets together: Not on file    Attends religious service: Not on file    Active member of club or organization: Not on file    Attends meetings of clubs or organizations: Not on file    Relationship status: Not on file  . Intimate partner violence:    Fear of current or ex partner: Not on file    Emotionally abused: Not on file    Physically abused: Not on file    Forced sexual activity: Not on file  Other Topics Concern  . Not on file  Social History Narrative  . Not on file   Family History  Problem Relation Age of Onset  . Cancer Mother   . Lupus Father   . Nephrotic syndrome Father   . Cancer Sister   . BRCA 1/2 Sister   . Breast  cancer Sister   . Stroke Maternal Grandmother   . Atrial fibrillation Maternal Grandmother   . Lung cancer Paternal Grandmother    Current Outpatient Medications on File Prior to Visit  Medication Sig  . acetaminophen (TYLENOL) 650 MG CR tablet Take 650 mg by mouth every 8 (eight) hours as needed for pain.  . BD PEN NEEDLE NANO U/F 32G X 4 MM MISC U UTD ONCE D  . buPROPion (WELLBUTRIN SR) 200 MG 12 hr tablet Take 1 tablet twice daily with naltrexone.  . calcium citrate-vitamin D (CITRACAL+D) 315-200 MG-UNIT tablet Take by mouth.  . dicyclomine (BENTYL) 10 MG capsule Take 10 mg by mouth 4 (four) times daily -  before meals and at bedtime.  Marland Kitchen FLUoxetine (PROZAC) 20 MG capsule Take 1 capsule (20 mg total) by mouth daily.  Marland Kitchen gabapentin  (NEURONTIN) 300 MG capsule Take 1 capsule (300 mg total) by mouth at bedtime.  Marland Kitchen loratadine (CLARITIN) 10 MG tablet Take 10 mg by mouth daily.  . Melatonin 3 MG TABS Take by mouth.  . methocarbamol (ROBAXIN) 500 MG tablet Take 1 tablet (500 mg total) by mouth 3 (three) times daily as needed for muscle spasms.  . midodrine (PROAMATINE) 5 MG tablet Take 1 tablet (5 mg total) by mouth 3 (three) times daily with meals.  . Multiple Vitamin (MULTIVITAMIN) capsule Take 1 capsule by mouth daily.  . naltrexone (DEPADE) 50 MG tablet Take 1/2 tablet twice daily with Bupropion. Taper as discussed.  Marland Kitchen omeprazole (PRILOSEC) 20 MG capsule Take 1 cap twice daily for 7 days.  Then, take 1 cap daily x 7 days.  Then 1 cap every other day x 7 days and stop. (Patient not taking: Reported on 05/22/2018)   No current facility-administered medications on file prior to visit.     Review of Systems  Constitutional: Negative for chills and fever.  HENT: Negative for congestion and sore throat.   Eyes: Negative for pain.  Respiratory: Negative for cough, shortness of breath and wheezing.   Cardiovascular: Negative for chest pain, palpitations and leg swelling.  Gastrointestinal: Negative for abdominal pain, blood in stool, constipation, diarrhea, nausea and vomiting.  Endocrine: Negative for polydipsia.  Genitourinary: Negative for dysuria, frequency, hematuria and urgency.  Musculoskeletal: Negative for back pain, myalgias and neck pain.  Skin: Negative.  Negative for rash.  Allergic/Immunologic: Negative for environmental allergies.  Neurological: Negative for dizziness, weakness and headaches.  Hematological: Does not bruise/bleed easily.  Psychiatric/Behavioral: Negative for dysphoric mood and suicidal ideas. The patient is not nervous/anxious.    Per HPI unless specifically indicated above    Objective:    BP (!) 104/45 (BP Location: Right Arm, Patient Position: Sitting, Cuff Size: Large)   Pulse 82   Temp  98 F (36.7 C) (Oral)   Ht '5\' 8"'$  (1.727 m)   Wt 248 lb (112.5 kg)   SpO2 100%   BMI 37.71 kg/m   Wt Readings from Last 3 Encounters:  05/22/18 248 lb (112.5 kg)  04/21/18 249 lb 12.8 oz (113.3 kg)  08/31/17 230 lb (104.3 kg)    Physical Exam  Constitutional: She is oriented to person, place, and time. She appears well-developed and well-nourished. No distress.  Central adiposity  HENT:  Head: Normocephalic and atraumatic.  Right Ear: External ear normal.  Left Ear: External ear normal.  Nose: Nose normal.  Mouth/Throat: Oropharynx is clear and moist.  Eyes: Pupils are equal, round, and reactive to light. Conjunctivae are normal.  Neck: Normal  range of motion. Neck supple. No JVD present. No tracheal deviation present. No thyromegaly present.  Cardiovascular: Normal rate, regular rhythm, normal heart sounds and intact distal pulses. Exam reveals no gallop and no friction rub.  No murmur heard. Pulmonary/Chest: Effort normal and breath sounds normal. No respiratory distress.  Abdominal: Soft. Bowel sounds are normal. She exhibits no distension. There is no hepatosplenomegaly. There is no tenderness.  Musculoskeletal: Normal range of motion.  Lymphadenopathy:    She has no cervical adenopathy.  Neurological: She is alert and oriented to person, place, and time. No cranial nerve deficit.  Skin: Skin is warm and dry.  Psychiatric: She has a normal mood and affect. Her behavior is normal. Judgment and thought content normal.  Nursing note and vitals reviewed.   Results for orders placed or performed during the hospital encounter of 08/31/17  Rapid strep screen  Result Value Ref Range   Streptococcus, Group A Screen (Direct) NEGATIVE NEGATIVE  Culture, group A strep  Result Value Ref Range   Specimen Description THROAT    Special Requests NONE Reflexed from [id number]    Culture      NO GROUP A STREP (S.PYOGENES) ISOLATED Performed at [facility] [address],  [place], [place] [address]    Report Status 09/03/2017 FINAL       Assessment & Plan:   Problem List Items Addressed This Visit      Digestive   Gastroesophageal reflux disease   Relevant Medications   omeprazole (PRILOSEC) 20 MG capsule     Other   Hemochromatosis   Relevant Orders   Pneumococcal polysaccharide vaccine 23-valent greater than or equal to 2yo subcutaneous/IM (Completed)   Iron, TIBC and Ferritin Panel    Other Visit Diagnoses    Encounter for annual physical exam    -  Primary   Relevant Orders   CBC with Differential/Platelet   COMPLETE METABOLIC PANEL WITH GFR   Hemoglobin A1c   Lipid panel   TSH   MM DIGITAL SCREENING BILATERAL   FSH/LH   Breast cancer screening by mammogram       Relevant Orders   MM DIGITAL SCREENING BILATERAL   Osteoporosis screening       Relevant Orders   DG Bone Density   Perimenopausal symptom       Relevant Orders   FSH/LH      # Physical exam with no new findings.  Well adult with several acute concerns. 1. Obtain health maintenance screenings as above according to age. - Increase physical activity to 30 minutes most days of the week.  - Eat healthy diet high in vegetables and fruits; low in refined carbohydrates. - Mammogram and bone density scans ordered. - FSH/LH for menopausal status determination. 2. Return 1 year for annual physical.  #Hemochromatosis: no new symptoms or changes reported.  Patient due for CBC and iron retesting.  Labs today.  Orders placed for standing q 3 month collection.  Follouwp 6 months for office visit.  #GERD and History of lap band surgery. Worsening early satiety, nausea and bloating since stopping omeprazole.  No new heartburn symptoms, however.  Past history of lab band surgery performed at [facility] with bariatric weight loss clinic. - Referral to [facility] for re-evaluation. - Followup as needed and in 3-6 months.    Meds ordered this encounter  Medications    . DISCONTD: omeprazole (PRILOSEC) 20 MG capsule    Sig: Take 1 capsule (20 mg total)  by mouth daily. Take 1 cap twice daily for 7 days.  Then, take 1 cap daily x 7 days.  Then 1 cap every other day x 7 days and stop.    Dispense:  90 capsule    Refill:  1    Order Specific Question:   Supervising Provider    Answer:   Olin Hauser [[id number]]  . omeprazole (PRILOSEC) 20 MG capsule    Sig: Take 1 capsule (20 mg total) by mouth daily.    Dispense:  90 capsule    Refill:  1    Order Specific Question:   Supervising Provider    Answer:   Olin Hauser [[id number]]    Follow up plan: Return in about 1 year (around 05/23/2019) for annual physical AND 6 months for hemochromatosis, GERD.  Cassell Smiles, DNP, AGPCNP-BC Adult Gerontology Primary Care Nurse Practitioner [facility] 05/22/2018, 9:28 AM

## 2018-05-22 NOTE — Patient Instructions (Addendum)
Morgan SellarFelicia Maslowski,   Thank you for coming in to clinic today.  1. Continue work on healthy eating and physical activity.  2. Resume omeprazole 20 mg once daily for your GERD, early satiety (full quickly).  Please schedule a follow-up appointment with [name], AGNP. Return in about 1 year (around 05/23/2019) for annual physical AND 6 months for hemochromatosis, GERD.  If you have any other questions or concerns, please feel free to call the clinic or send a message through MyChart. You may also schedule an earlier appointment if necessary.  You will receive a survey after today's visit either digitally by e-mail or paper by [employer] mail. Your experiences and feedback matter to us.  Please respond so we know how we are doing as we provide care for you.   Wilhelmina McardleLauren Joandy Burget, DNP, AGNP-BC Adult Gerontology Nurse Practitioner [facility]

## 2018-05-23 ENCOUNTER — Encounter: Payer: Self-pay | Admitting: Nurse Practitioner

## 2018-05-23 DIAGNOSIS — Z780 Asymptomatic menopausal state: Secondary | ICD-10-CM | POA: Insufficient documentation

## 2018-05-23 LAB — COMPLETE METABOLIC PANEL WITH GFR
AG Ratio: 1.8 (calc) (ref 1.0–2.5)
ALT: 21 U/L (ref 6–29)
AST: 23 U/L (ref 10–35)
Albumin: 4.4 g/dL (ref 3.6–5.1)
Alkaline phosphatase (APISO): 86 U/L (ref 33–130)
BUN/Creatinine Ratio: 17 (calc) (ref 6–22)
BUN: 19 mg/dL (ref 7–25)
CO2: 27 mmol/L (ref 20–32)
Calcium: 9.7 mg/dL (ref 8.6–10.4)
Chloride: 104 mmol/L (ref 98–110)
Creat: 1.14 mg/dL — ABNORMAL HIGH (ref 0.50–1.05)
GFR, Est African American: 65 mL/min/{1.73_m2} (ref >=60)
GFR, Est Non African American: 56 mL/min/{1.73_m2} — ABNORMAL LOW (ref >=60)
Globulin: 2.4 g/dL (calc) (ref 1.9–3.7)
Glucose, Bld: 100 mg/dL — ABNORMAL HIGH (ref 65–99)
Potassium: 4.6 mmol/L (ref 3.5–5.3)
Sodium: 140 mmol/L (ref 135–146)
Total Bilirubin: 0.5 mg/dL (ref 0.2–1.2)
Total Protein: 6.8 g/dL (ref 6.1–8.1)

## 2018-05-23 LAB — CBC WITH DIFFERENTIAL/PLATELET
Basophils Absolute: 32 cells/uL (ref 0–200)
Basophils Relative: 0.6 %
Eosinophils Absolute: 58 cells/uL (ref 15–500)
Eosinophils Relative: 1.1 %
HCT: 41.5 % (ref 35.0–45.0)
Hemoglobin: 13.7 g/dL (ref 11.7–15.5)
Lymphs Abs: 1235 cells/uL (ref 850–3900)
MCH: 34.4 pg — ABNORMAL HIGH (ref 27.0–33.0)
MCHC: 33 g/dL (ref 32.0–36.0)
MCV: 104.3 fL — ABNORMAL HIGH (ref 80.0–100.0)
MPV: 9.2 fL (ref 7.5–12.5)
Monocytes Relative: 7.9 %
Neutro Abs: 3556 cells/uL (ref 1500–7800)
Neutrophils Relative %: 67.1 %
Platelets: 303 10*3/uL (ref 140–400)
RBC: 3.98 10*6/uL (ref 3.80–5.10)
RDW: 12.1 % (ref 11.0–15.0)
Total Lymphocyte: 23.3 %
WBC mixed population: 419 cells/uL (ref 200–950)
WBC: 5.3 10*3/uL (ref 3.8–10.8)

## 2018-05-23 LAB — IRON,TIBC AND FERRITIN PANEL
%SAT: 87 % (calc) — ABNORMAL HIGH (ref 16–45)
Ferritin: 43 ng/mL (ref 16–232)
Iron: 233 ug/dL — ABNORMAL HIGH (ref 45–160)
TIBC: 269 mcg/dL (calc) (ref 250–450)

## 2018-05-23 LAB — LIPID PANEL
Cholesterol: 212 mg/dL — ABNORMAL HIGH (ref <200)
HDL: 72 mg/dL (ref >50)
LDL Cholesterol (Calc): 112 mg/dL (calc) — ABNORMAL HIGH
Non-HDL Cholesterol (Calc): 140 mg/dL (calc) — ABNORMAL HIGH (ref <130)
Total CHOL/HDL Ratio: 2.9 (calc) (ref <5.0)
Triglycerides: 159 mg/dL — ABNORMAL HIGH (ref <150)

## 2018-05-23 LAB — HEMOGLOBIN A1C
Hgb A1c MFr Bld: 5.4 % of total Hgb (ref <5.7)
Mean Plasma Glucose: 108 (calc)
eAG (mmol/L): 6 (calc)

## 2018-05-23 LAB — TSH: TSH: 1.86 mIU/L

## 2018-05-23 LAB — FSH/LH
FSH: 73.3 m[IU]/mL
LH: 50.4 m[IU]/mL

## 2018-06-02 ENCOUNTER — Ambulatory Visit
Admission: RE | Admit: 2018-06-02 | Discharge: 2018-06-02 | Disposition: A | Discharge status: Home / Self Care | Payer: 59 | Source: Ambulatory Visit | Attending: Nurse Practitioner | Admitting: Nurse Practitioner

## 2018-06-02 DIAGNOSIS — Z0000 Encounter for general adult medical examination without abnormal findings: Secondary | ICD-10-CM | POA: Diagnosis present

## 2018-06-02 DIAGNOSIS — Z1231 Encounter for screening mammogram for malignant neoplasm of breast: Secondary | ICD-10-CM | POA: Insufficient documentation

## 2018-06-07 ENCOUNTER — Inpatient Hospital Stay: Admission: RE | Admit: 2018-06-07 | Payer: BLUE CROSS/BLUE SHIELD | Source: Ambulatory Visit

## 2018-06-15 ENCOUNTER — Encounter (INDEPENDENT_AMBULATORY_CARE_PROVIDER_SITE_OTHER): Payer: Self-pay

## 2018-06-15 ENCOUNTER — Ambulatory Visit
Admission: RE | Admit: 2018-06-15 | Discharge: 2018-06-15 | Disposition: A | Discharge status: Home / Self Care | Payer: 59 | Source: Ambulatory Visit | Attending: Nurse Practitioner | Admitting: Nurse Practitioner

## 2018-06-15 DIAGNOSIS — Z13820 Encounter for screening for osteoporosis: Secondary | ICD-10-CM | POA: Diagnosis present

## 2018-07-25 ENCOUNTER — Ambulatory Visit
Admission: EM | Admit: 2018-07-25 | Discharge: 2018-07-25 | Disposition: A | Discharge status: Home / Self Care | Payer: 59 | Attending: Family Medicine | Admitting: Family Medicine

## 2018-07-25 ENCOUNTER — Encounter: Payer: Self-pay | Admitting: Emergency Medicine

## 2018-07-25 ENCOUNTER — Other Ambulatory Visit: Payer: Self-pay

## 2018-07-25 DIAGNOSIS — L02416 Cutaneous abscess of left lower limb: Secondary | ICD-10-CM

## 2018-07-25 DIAGNOSIS — L0291 Cutaneous abscess, unspecified: Secondary | ICD-10-CM

## 2018-07-25 MED ORDER — FLUCONAZOLE 150 MG PO TABS: 150.0000 mg | ORAL_TABLET | Freq: Once | ORAL | 1 refills | Status: AC

## 2018-07-25 MED ORDER — DOXYCYCLINE HYCLATE 100 MG PO CAPS: 100.0000 mg | ORAL_CAPSULE | Freq: Two times a day (BID) | ORAL | 0 refills | Status: DC

## 2018-07-25 NOTE — ED Triage Notes (Signed)
Pt c/o abscess on her left inner thigh that started 4 days ago. She has been putting warm compresses on it without relief.

## 2018-07-25 NOTE — Discharge Instructions (Signed)
Remove packing in 48 hours.  Antibiotic as prescribed.  Take care  Dr. Amiri Tritch  

## 2018-07-25 NOTE — ED Provider Notes (Signed)
[facility]    CSN: [id number] Arrival date & time: 07/25/18  1919  History   Chief Complaint Chief Complaint  Patient presents with  . Abscess    left inner thigh    HPI  52 year old female presents with abscess.  4-day history of abscess of the left inner thigh.  Surrounding erythema.  Moderate to severe pain.  Worse with pressure and palpation.  She is been using warm compresses without relief.  Area feels warm.  She is afebrile.  No medications tried.  No other reported symptoms.  No current drainage.  No other complaints.  PMH, Surgical Hx, Family Hx, Social History reviewed and updated as below.  Past Medical History:  Diagnosis Date  . Allergy   . Anxiety   . BRCA negative   . Depression   . GERD (gastroesophageal reflux disease)   . Hemochromatosis   . Spondylolisthesis    lumbar    Patient Active Problem List   Diagnosis Date Noted  . Postmenopausal 05/23/2018  . Hereditary hemochromatosis ([place]) 05/22/2018  . Gastroesophageal reflux disease 05/22/2018  . Osteoarthritis 05/22/2018  . Syncope due to orthostatic hypotension 09/14/2017  . Obesity (BMI 35.0-39.9 without comorbidity) 02/09/2017  . S/P laparoscopic sleeve gastrectomy 02/08/2017  . Allergic rhinitis due to animal hair and dander 10/10/2014  . Chronic anxiety 10/10/2014  . FH: breast cancer 10/10/2014  . H/O: hysterectomy 10/10/2014    Past Surgical History:  Procedure Laterality Date  . ABDOMINAL HYSTERECTOMY    . CESAREAN SECTION    . CHOLECYSTECTOMY    . LAPAROSCOPIC GASTRIC SLEEVE RESECTION      OB History   None      Home Medications    Prior to Admission medications   Medication Sig Start Date End Date Taking? Authorizing Provider  acetaminophen (TYLENOL) 650 MG CR tablet Take 650 mg by mouth every 8 (eight) hours as needed for pain.   Yes [provider]  buPROPion (WELLBUTRIN SR) 200 MG 12 hr tablet Take 1 tablet twice daily with naltrexone. 04/21/18  Yes  Mikey College, NP  calcium citrate-vitamin D (CITRACAL+D) 315-200 MG-UNIT tablet Take by mouth.   Yes [provider]  dicyclomine (BENTYL) 10 MG capsule Take 10 mg by mouth 4 (four) times daily -  before meals and at bedtime.   Yes [provider]  FLUoxetine (PROZAC) 20 MG capsule Take 1 capsule (20 mg total) by mouth daily. 04/21/18  Yes Mikey College, NP  gabapentin (NEURONTIN) 300 MG capsule Take 1 capsule (300 mg total) by mouth at bedtime. 04/21/18  Yes Mikey College, NP  loratadine (CLARITIN) 10 MG tablet Take 10 mg by mouth daily.   Yes [provider]  Melatonin 3 MG TABS Take by mouth.   Yes [provider]  methocarbamol (ROBAXIN) 500 MG tablet Take 1 tablet (500 mg total) by mouth 3 (three) times daily as needed for muscle spasms. 04/21/18  Yes Mikey College, NP  midodrine (PROAMATINE) 5 MG tablet Take 1 tablet (5 mg total) by mouth 3 (three) times daily with meals. 04/21/18  Yes Mikey College, NP  Multiple Vitamin (MULTIVITAMIN) capsule Take 1 capsule by mouth daily.   Yes [provider]  naltrexone (DEPADE) 50 MG tablet Take 1/2 tablet twice daily with Bupropion. Taper as discussed. 04/21/18  Yes Mikey College, NP  omeprazole (PRILOSEC) 20 MG capsule Take 1 capsule (20 mg total) by mouth daily. 05/22/18  Yes Mikey College, NP  BD PEN NEEDLE NANO U/F 32G X 4 MM MISC U UTD ONCE D 03/27/18   [provider]  doxycycline (VIBRAMYCIN) 100 MG capsule Take 1 capsule (100 mg total) by mouth 2 (two) times daily. 07/25/18   Coral Spikes, DO  fluconazole (DIFLUCAN) 150 MG tablet Take 1 tablet (150 mg total) by mouth once for 1 dose. Repeat dose in 72 hours. 07/25/18 07/25/18  Coral Spikes, DO    Family History Family History  Problem Relation Age of Onset  . Cancer Mother   . Breast cancer Mother 77       Ovarian 10  . BRCA 1/2 Mother   . Lupus Father   . Nephrotic syndrome Father   .  Cancer Sister   . BRCA 1/2 Sister   . Breast cancer Sister 85  . Stroke Maternal Grandmother   . Atrial fibrillation Maternal Grandmother   . Lung cancer Paternal Grandmother     Social History Social History   Tobacco Use  . Smoking status: Never Smoker  . Smokeless tobacco: Never Used  Substance Use Topics  . Alcohol use: Yes    Comment: rarely (<5 drinks per year)  . Drug use: Never     Allergies   Codeine; Decadron [dexamethasone]; Erythromycin; and Penicillins   Review of Systems Review of Systems  Constitutional: Negative.   Skin:       Abscess.   Physical Exam Triage Vital Signs ED Triage Vitals  Enc Vitals Group     BP 07/25/18 1934 (!) 109/58     Pulse Rate 07/25/18 1934 74     Resp 07/25/18 1934 17     Temp 07/25/18 1934 98.2 F (36.8 C)     Temp Source 07/25/18 1934 Oral     SpO2 07/25/18 1934 100 %     Weight 07/25/18 1932 244 lb (110.7 kg)     Height 07/25/18 1932 _0  (1.727 m)     Head Circumference --      Peak Flow --      Pain Score 07/25/18 1932 2     Pain Loc --      Pain Edu? --      Excl. in [place]? --    Updated Vital Signs BP (!) 109/58 (BP Location: Left Arm)   Pulse 74   Temp 98.2 F (36.8 C) (Oral)   Resp 17   Ht _1  (1.727 m)   Wt 110.7 kg   SpO2 100%   BMI 37.10 kg/m   Visual Acuity Right Eye Distance:   Left Eye Distance:   Bilateral Distance:    Right Eye Near:   Left Eye Near:    Bilateral Near:     Physical Exam  Constitutional: She is oriented to person, place, and time. She appears well-developed. No distress.  HENT:  Head: Normocephalic and atraumatic.  Pulmonary/Chest: Effort normal. No respiratory distress.  Neurological: She is alert and oriented to person, place, and time.  Skin:  Left medial inner thigh with a large area of erythema with a central raised area of fluctuance.  Psychiatric: She has a normal mood and affect. Her behavior is normal.  Nursing note and vitals reviewed.  UC Treatments /  Results  Labs (all labs ordered are listed, but only abnormal results are displayed) Labs Reviewed - No data to display  EKG None  Radiology No results found.  Procedures Incision and Drainage Date/Time: 07/25/2018 9:00 PM Performed by: Coral Spikes, DO Authorized  by: Coral Spikes, DO   Consent:    Consent obtained:  Verbal   Consent given by:  Patient   Alternatives discussed:  No treatment and alternative treatment Location:    Type:  Abscess   Location:  Lower extremity   Lower extremity location:  Leg   Leg location:  L upper leg Pre-procedure details:    Skin preparation:  Betadine Anesthesia (see MAR for exact dosages):    Anesthesia method:  Local infiltration   Local anesthetic:  Lidocaine 1% w/o epi Procedure type:    Complexity:  Simple Procedure details:    Incision types:  Stab incision   Scalpel blade:  11   Drainage:  Bloody and purulent   Drainage amount:  Moderate   Wound treatment:  Wound left open   Packing materials:  1/4 in gauze Post-procedure details:    Patient tolerance of procedure:  Tolerated well, no immediate complications   (including critical care time)  Medications Ordered in UC Medications - No data to display  Initial Impression / Assessment and Plan / UC Course  I have reviewed the triage vital signs and the nursing notes.  Pertinent labs & imaging results that were available during my care of the patient were reviewed by me and considered in my medical decision making (see chart for details).    52 year old female presents with abscess.  Incision and drainage performed as above.  Patient can remove packing in 48 hours.  Doxycycline as prescribed.  Diflucan if needed for yeast vaginitis.  Final Clinical Impressions(s) / UC Diagnoses   Final diagnoses:  Abscess     Discharge Instructions     Remove packing in 48 hours.   Antibiotic as prescribed.  Take care  Dr. Lacinda Axon    ED Prescriptions    Medication Sig  Dispense Auth. Provider   doxycycline (VIBRAMYCIN) 100 MG capsule Take 1 capsule (100 mg total) by mouth 2 (two) times daily. 14 capsule Madaleine Simmon G, DO   fluconazole (DIFLUCAN) 150 MG tablet Take 1 tablet (150 mg total) by mouth once for 1 dose. Repeat dose in 72 hours. 2 tablet Coral Spikes, DO     Controlled Substance Prescriptions [place] Controlled Substance Registry consulted? Not Applicable   Coral Spikes, DO 07/25/18 2101

## 2018-09-13 ENCOUNTER — Other Ambulatory Visit: Payer: Self-pay | Admitting: Nurse Practitioner

## 2018-09-14 MED ORDER — BUPROPION HCL ER (SR) 200 MG PO TB12: ORAL_TABLET | ORAL | 0 refills | Status: DC

## 2018-10-16 ENCOUNTER — Other Ambulatory Visit: Payer: Self-pay | Admitting: Nurse Practitioner

## 2018-10-25 ENCOUNTER — Other Ambulatory Visit: Payer: Self-pay

## 2018-10-25 ENCOUNTER — Encounter: Payer: Self-pay | Admitting: Nurse Practitioner

## 2018-10-25 ENCOUNTER — Ambulatory Visit (INDEPENDENT_AMBULATORY_CARE_PROVIDER_SITE_OTHER): Payer: 59 | Admitting: Nurse Practitioner

## 2018-10-25 VITALS — BP 112/64 | HR 79 | Temp 98.5°F | Ht 68.0 in | Wt 243.4 lb

## 2018-10-25 DIAGNOSIS — M19031 Primary osteoarthritis, right wrist: Secondary | ICD-10-CM | POA: Diagnosis not present

## 2018-10-25 DIAGNOSIS — K582 Mixed irritable bowel syndrome: Secondary | ICD-10-CM | POA: Diagnosis not present

## 2018-10-25 NOTE — Patient Instructions (Addendum)
Dulce SellarFelicia Mikus,   Thank you for coming in to clinic today.  1. START Metamucil or Citrucel - take 1/4 dose daily and increase to full dose as needed to have more regular formed bowel movements and bowel movement at least every 3 days.  2. START IBgard in about 2-3 weeks if needed for abdominal pain and cramping, diarrhea, and/or constipation.  3.  Then we will discuss amitiza, linzess, and dicyclomine (strictly for diarrhea and cramping).  4. For your RIGHT arm/wrist  - START Aleve 220 mg twice daily for 7-14 days. - Apply heat or ice.  Heat may be better for muscle relaxation. - Change your hand posture on your mouse.  Think relaxed fingers and wrist.  Push with your hand and arm, not your fingers. - Consider occupational therapy or workplace safety evaluation from employer if not improving.  Please schedule a follow-up appointment with Wilhelmina McardleLauren Sherley Leser, AGNP. Return in about 3 months (around 01/24/2019) for IBS.  If you have any other questions or concerns, please feel free to call the clinic or send a message through MyChart. You may also schedule an earlier appointment if necessary.  You will receive a survey after today's visit either digitally by e-mail or paper by [employer] mail. Your experiences and feedback matter to us.  Please respond so we know how we are doing as we provide care for you.   Wilhelmina McardleLauren Demarkus Remmel, DNP, AGNP-BC Adult Gerontology Nurse Practitioner [facility], [id number]

## 2018-10-25 NOTE — Progress Notes (Signed)
Subjective:    Patient ID: Morgan Hobbs, female    DOB: Jan 21, 1966, 52 y.o.   MRN: [id number]  Morgan Hobbs is a 52 y.o. female presenting on 10/25/2018 for Irritable Bowel Syndrome (severe abdominal cramps, pt states she goes between constipation and diarrhea frequently. Diagnose, but never no treatment for the issue.) and Arm Pain (Right forearm pain x 2 mths that have worsening over the past month. Pt states that she think her pain is coming from holding a mouse at work constantly.)   HPI  IBS Patient notes worsening symptoms recently of abdominal cramping, constipation and diarrhea. Has never started treatment for this in past, but is now seeking advice without any normal bowel movements in recent months.  Prefers to avoid many of the prescription meds for IBS 2/2 side effects. - In last 1 week was having abdominal cramps, but could not have BM. - Has never had regular BM timing, sometimes 1x or 2x per week. - Patient has taken stool softener and Miralax - Patient can have difficulty starting BM and have intially very hard stool followed by a "mushy pile." - Other times, patient notes primarily constipation.  Patient sometimes has cramping without BM.  - No changes to pain/cramping with meals. - Occasionally epigastric abdominal pain improves after having BM.  Arm Pain Right forearm pain over 2 moths.  Has regular and nearly constant use of computer mouse at work.  Patient demonstrated a very lateralized position of hand to hold her mouse. Proximal/Upper and lower forearm along arm. Some weakness with pain.  Is not taking oral nsaids with gastric bypass.  Ice helps at night, but has pain   Social History   Tobacco Use  . Smoking status: Never Smoker  . Smokeless tobacco: Never Used  Substance Use Topics  . Alcohol use: Yes    Comment: rarely (<5 drinks per year)  . Drug use: Never    Review of Systems Per HPI unless specifically indicated above     Objective:      BP 112/64 (BP Location: Right Arm, Patient Position: Sitting, Cuff Size: Large)   Pulse 79   Temp 98.5 F (36.9 C) (Oral)   Ht 5\' 8"  (1.727 m)   Wt 243 lb 6.4 oz (110.4 kg)   BMI 37.01 kg/m   Wt Readings from Last 3 Encounters:  10/25/18 243 lb 6.4 oz (110.4 kg)  07/25/18 244 lb (110.7 kg)  05/22/18 248 lb (112.5 kg)    Physical Exam Vitals signs reviewed.  Constitutional:      General: She is not in acute distress.    Appearance: She is well-developed.  HENT:     Head: Normocephalic and atraumatic.  Neck:     Musculoskeletal: Normal range of motion and neck supple.     Vascular: No carotid bruit.  Cardiovascular:     Rate and Rhythm: Normal rate and regular rhythm.     Heart sounds: Normal heart sounds, S1 normal and S2 normal.  Pulmonary:     Effort: Pulmonary effort is normal. No respiratory distress.     Breath sounds: Normal breath sounds.  Abdominal:     General: Bowel sounds are normal. There is no distension.     Palpations: Abdomen is soft.     Tenderness: There is no abdominal tenderness.     Hernia: No hernia is present.  Musculoskeletal:     Right wrist: She exhibits tenderness (mild tenderness of wrist at 1st MCP joint). She exhibits normal range of  motion, no bony tenderness, no swelling, no effusion, no crepitus, no deformity and no laceration.     Right forearm: She exhibits no tenderness (mild hypertonicity of proximal forearm muscles), no bony tenderness, no swelling, no edema, no deformity and no laceration.     Comments: Negative Phalen and Tinel signs.  Skin:    General: Skin is warm and dry.  Neurological:     Mental Status: She is alert and oriented to person, place, and time.  Psychiatric:        Behavior: Behavior normal.    Results for orders placed or performed in visit on 05/22/18  CBC with Differential/Platelet  Result Value Ref Range   WBC 5.3 3.8 - 10.8 Thousand/uL   RBC 3.98 3.80 - 5.10 Million/uL   Hemoglobin 13.7 11.7 - 15.5 g/dL    HCT 21.3 08.6 - 57.8 %   MCV 104.3 (H) 80.0 - 100.0 fL   MCH 34.4 (H) 27.0 - 33.0 pg   MCHC 33.0 32.0 - 36.0 g/dL   RDW 46.9 62.9 - 52.8 %   Platelets 303 140 - 400 Thousand/uL   MPV 9.2 7.5 - 12.5 fL   Neutro Abs 3,556 1,500 - 7,800 cells/uL   Lymphs Abs 1,235 850 - 3,900 cells/uL   WBC mixed population 419 200 - 950 cells/uL   Eosinophils Absolute 58 15 - 500 cells/uL   Basophils Absolute 32 0 - 200 cells/uL   Neutrophils Relative % 67.1 %   Total Lymphocyte 23.3 %   Monocytes Relative 7.9 %   Eosinophils Relative 1.1 %   Basophils Relative 0.6 %  COMPLETE METABOLIC PANEL WITH GFR  Result Value Ref Range   Glucose, Bld 100 (H) 65 - 99 mg/dL   BUN 19 7 - 25 mg/dL   Creat 4.13 (H) 2.44 - 1.05 mg/dL   GFR, Est Non African American 56 (L) > OR = 60 mL/min/1.75m2   GFR, Est African American 65 > OR = 60 mL/min/1.88m2   BUN/Creatinine Ratio 17 6 - 22 (calc)   Sodium 140 135 - 146 mmol/L   Potassium 4.6 3.5 - 5.3 mmol/L   Chloride 104 98 - 110 mmol/L   CO2 27 20 - 32 mmol/L   Calcium 9.7 8.6 - 10.4 mg/dL   Total Protein 6.8 6.1 - 8.1 g/dL   Albumin 4.4 3.6 - 5.1 g/dL   Globulin 2.4 1.9 - 3.7 g/dL (calc)   AG Ratio 1.8 1.0 - 2.5 (calc)   Total Bilirubin 0.5 0.2 - 1.2 mg/dL   Alkaline phosphatase (APISO) 86 33 - 130 U/L   AST 23 10 - 35 U/L   ALT 21 6 - 29 U/L  Hemoglobin A1c  Result Value Ref Range   Hgb A1c MFr Bld 5.4 <5.7 % of total Hgb   Mean Plasma Glucose 108 (calc)   eAG (mmol/L) 6.0 (calc)  Lipid panel  Result Value Ref Range   Cholesterol 212 (H) <200 mg/dL   HDL 72 >01 mg/dL   Triglycerides 027 (H) <150 mg/dL   LDL Cholesterol (Calc) 112 (H) mg/dL (calc)   Total CHOL/HDL Ratio 2.9 <5.0 (calc)   Non-HDL Cholesterol (Calc) 140 (H) <130 mg/dL (calc)  TSH  Result Value Ref Range   TSH 1.86 mIU/L  Iron, TIBC and Ferritin Panel  Result Value Ref Range   Iron 233 (H) 45 - 160 mcg/dL   TIBC 253 664 - 403 mcg/dL (calc)   %SAT 87 (H) 16 - 45 % (calc)  Ferritin  43 16 - 232 ng/mL  FSH/LH  Result Value Ref Range   FSH 73.3 mIU/mL   LH 50.4 mIU/mL      Assessment & Plan:   Problem List Items Addressed This Visit      Digestive   Irritable bowel syndrome with both constipation and diarrhea - Primary Uncontrolled IBS.  Prior treatment - OTC miralax and stool softener.  Patient has predominant constipation symptoms, but experiences both constipation and diarrhea.   - No prior colonoscopy for evaluation.    Plan: 1. START metamucil or citrucel 1/4 dose daily, increase prn for formed and regular frequency of BM.   2. May start IBgard after fiber bulk supplement for symptoms.  Take one cap daily. 3. Can consider future use of dicyclomine, Linzess/Amitiza. 4. Encouraged adequate water and fiber intake, regular physcial activity. 5. Follow-up 3 months or sooner if needed.    Other Visit Diagnoses    Arthropathy of right wrist     Pain likely self-limited and inflammatory origin.  Muscle strain possible complicated by poor wrist posture for computer mouse use.  Plan:  1. Treat with OTC pain meds (acetaminophen and Aleve).  Discussed alternate dosing and max dosing.  - Take Aleve 220 mg one tab bid x 7-14 days for initial reduction of inflammation 2. Apply heat and/or ice to affected area. 3. May also apply a muscle rub with lidocaine or lidocaine patch after heat or ice. 4. Can consider OT or OSHA workplace safety evaluation if available. - Recommended proper mouse grip and demonstrated use. 5. Follow up prn and in 3 months.        Follow up plan: Return in about 3 months (around 01/24/2019) for IBS.  Wilhelmina McardleLauren Haliegh Khurana, DNP, AGPCNP-BC Adult Gerontology Primary Care Nurse Practitioner [facility] 10/25/2018, 10:13 AM

## 2018-11-17 ENCOUNTER — Other Ambulatory Visit: Payer: Self-pay | Admitting: Nurse Practitioner

## 2019-03-05 ENCOUNTER — Other Ambulatory Visit: Payer: Self-pay | Admitting: Nurse Practitioner

## 2019-03-07 ENCOUNTER — Ambulatory Visit (INDEPENDENT_AMBULATORY_CARE_PROVIDER_SITE_OTHER): Payer: 59 | Admitting: Family Medicine

## 2019-03-07 ENCOUNTER — Encounter: Payer: Self-pay | Admitting: Family Medicine

## 2019-03-07 ENCOUNTER — Other Ambulatory Visit: Payer: Self-pay

## 2019-03-07 DIAGNOSIS — G8929 Other chronic pain: Secondary | ICD-10-CM

## 2019-03-07 DIAGNOSIS — M5441 Lumbago with sciatica, right side: Secondary | ICD-10-CM | POA: Diagnosis not present

## 2019-03-07 MED ORDER — NALTREXONE HCL 50 MG PO TABS: 25.0000 mg | ORAL_TABLET | Freq: Two times a day (BID) | ORAL | 5 refills | Status: DC

## 2019-03-07 MED ORDER — METHOCARBAMOL 500 MG PO TABS: 500.0000 mg | ORAL_TABLET | Freq: Three times a day (TID) | ORAL | 2 refills | Status: DC | PRN

## 2019-03-07 MED ORDER — BUPROPION HCL ER (SR) 200 MG PO TB12: 200.0000 mg | ORAL_TABLET | Freq: Two times a day (BID) | ORAL | 5 refills | Status: DC

## 2019-03-07 NOTE — Patient Instructions (Addendum)
Morgan Hobbs,  I have refilled your medication as discussed, for 30 day supply with refills up to 6 months  DUE for FASTING BLOOD WORK (no food or drink after midnight before the lab appointment, only water or coffee without cream/sugar on the morning of)  SCHEDULE "Lab Only" visit in the morning at the clinic for lab draw in 3 MONTHS   - Make sure Lab Only appointment is at about 1 week before your next appointment, so that results will be available  For Lab Results, once available within 2-3 days of blood draw, you can can log in to MyChart online to view your results and a brief explanation. Also, we can discuss results at next follow-up visit.   Please schedule a Follow-up Appointment to: Return in about 3 months (around 06/06/2019) for Annual Physical.  If you have any other questions or concerns, please feel free to call the office or send a message through MyChart. You may also schedule an earlier appointment if necessary.  Additionally, you may be receiving a survey about your experience at our office within a few days to 1 week by e-mail or mail. We value your feedback.  Morgan Pilar, DO [facility], [place]

## 2019-03-07 NOTE — Telephone Encounter (Signed)
Attempted to contact the pt, no answer. LMOM to return my call.  

## 2019-03-07 NOTE — Telephone Encounter (Signed)
Patient advised.

## 2019-03-07 NOTE — Telephone Encounter (Signed)
Needs virtual appointment for refills.  Morgan Pilar, DO [facility] 03/07/2019, 8:22 AM

## 2019-03-07 NOTE — Progress Notes (Signed)
Virtual Visit via Telephone The purpose of this virtual visit is to provide medical care while limiting exposure to the novel coronavirus (COVID19) for both patient and office staff.  Consent was obtained for phone visit:  Yes.   Answered questions that patient had about telehealth interaction:  Yes.   I discussed the limitations, risks, security and privacy concerns of performing an evaluation and management service by telephone. I also discussed with the patient that there may be a patient responsible charge related to this service. The patient expressed understanding and agreed to proceed.  Patient Location: Home Provider Location: [facility])  ---------------------------------------------------------------------- Chief Complaint  Patient presents with  . Obesity    weight management  . back spasm    S: Reviewed CMA documentation. 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 Denies any known or suspected exposure to person with or possibly with COVID19.  Denies any fevers, chills, sweats, body ache, cough, shortness of breath, sinus pain or pressure, headache, abdominal pain, diarrhea   Past Medical History:  Diagnosis Date  . Allergy   . Anxiety   .  BRCA negative   . Depression   . GERD (gastroesophageal reflux disease)   . Hemochromatosis   . Spondylolisthesis    lumbar   Social History   Tobacco Use  . Smoking status: Never Smoker  . Smokeless tobacco: Never Used  Substance Use Topics  . Alcohol use: Yes    Comment: rarely (<5 drinks per year)  . Drug use: Never    Current Outpatient Medications:  .  acetaminophen (TYLENOL) 650 MG CR tablet, Take 650 mg by mouth every 8 (eight) hours as needed for pain., Disp: , Rfl:  .  BD PEN NEEDLE NANO U/F 32G X 4 MM MISC, U UTD ONCE D, Disp: , Rfl: 1 .  buPROPion (WELLBUTRIN SR) 200 MG 12 hr tablet, Take 1 tablet (200 mg total) by mouth 2 (two) times daily. Take with naltrexone, Disp: 60 tablet, Rfl: 5 .  calcium citrate-vitamin D (CITRACAL+D) 315-200 MG-UNIT tablet, Take by mouth., Disp: , Rfl:  .  dicyclomine (BENTYL) 10 MG capsule, Take 10 mg by mouth as needed. , Disp: , Rfl:  .  FLUoxetine (PROZAC) 20 MG capsule, Take 1 capsule (20 mg total) by mouth daily., Disp: 30 capsule, Rfl: 5 .  gabapentin (NEURONTIN) 300 MG capsule, Take 1 capsule (300 mg total) by mouth at bedtime., Disp: 90 capsule, Rfl: 3 .  loratadine (CLARITIN) 10 MG tablet, Take 10 mg by mouth daily., Disp: , Rfl:  .  Melatonin 3 MG TABS, Take by mouth., Disp: , Rfl:  .  methocarbamol (ROBAXIN) 500 MG tablet, Take 1 tablet (500 mg total) by mouth 3 (three) times daily as needed for muscle spasms., Disp: 30 tablet, Rfl: 2 .  midodrine (PROAMATINE) 5 MG tablet, Take 1 tablet (5 mg total) by mouth 3 (three) times daily with meals., Disp: 90 tablet, Rfl: 1 .  Multiple Vitamin (MULTIVITAMIN) capsule, Take 1 capsule by mouth daily., Disp: , Rfl:  .  naltrexone (DEPADE) 50 MG tablet, Take 0.5 tablets (25 mg total) by mouth 2 (two) times a day. Take with Bupriopion, Disp: 30 tablet, Rfl: 5 .  omeprazole (PRILOSEC) 20 MG capsule, Take 1 capsule (20 mg total) by mouth daily., Disp: 90 capsule, Rfl: 1  Depression screen [facility] 2/9  03/07/2019 05/22/2018 04/21/2018  Decreased Interest 0 0 0  Down, Depressed, Hopeless 0 0 0  PHQ - 2 Score 0 0 0  Altered sleeping - 1 -  Tired, decreased energy - 1 -  Change in appetite - 2 -  Feeling bad or failure about yourself  - 0 -  Trouble concentrating - 0 -  Moving slowly or fidgety/restless - 0 -  Suicidal thoughts - 0 -  PHQ-9 Score - 4 -  Difficult doing work/chores - Not difficult at all -    GAD 7 : Generalized Anxiety Score 03/07/2019  Nervous, Anxious, on Edge 2  Control/stop worrying 1  Worry too much - different things 1  Trouble relaxing 3  Restless 3  Easily annoyed or irritable 1  Afraid - awful might happen 0  Total GAD 7 Score 11  Anxiety Difficulty Not difficult at all    -------------------------------------------------------------------------- O: No physical exam performed due to remote telephone encounter.   No results found for this or any previous visit (from the past 2160 hour(s)).  -------------------------------------------------------------------------- A&P:  Problem List Items Addressed This Visit    None    Visit Diagnoses    Morbid obesity ([place])    -  Primary  Gradual wt loss with reduced appetite on current medication management - Improving lifestyle and life stressors  Continue current management - agree to refill current bupropion '200mg'$  BID and naltrexon '25mg'$  BID (half of '50mg'$ ) for appetite - Follow-up with PCP as advised in 3 months     Relevant Medications   buPROPion (WELLBUTRIN SR) 200 MG 12 hr tablet   naltrexone (DEPADE) 50 MG tablet   Chronic midline low back pain with right-sided sciatica     Chronic issue, has had episodic flare up Previously on methocarbamol PRN - with good results, rarely taking Refill today     Relevant Medications   buPROPion (WELLBUTRIN SR) 200 MG 12 hr tablet   methocarbamol (ROBAXIN) 500 MG tablet      Meds ordered this encounter  Medications  . buPROPion (WELLBUTRIN SR) 200 MG 12 hr  tablet    Sig: Take 1 tablet (200 mg total) by mouth 2 (two) times daily. Take with naltrexone    Dispense:  60 tablet    Refill:  5  . naltrexone (DEPADE) 50 MG tablet    Sig: Take 0.5 tablets (25 mg total) by mouth 2 (two) times a day. Take with Bupriopion    Dispense:  30 tablet    Refill:  5  . methocarbamol (ROBAXIN) 500 MG tablet    Sig: Take 1 tablet (500 mg total) by mouth 3 (three) times daily as needed for muscle spasms.    Dispense:  30 tablet    Refill:  2    Follow-up: - Return in 3 months for Annual Physical w/ PCP, labs to be ordered on day of physical  Patient verbalizes understanding with the above medical recommendations  including the limitation of remote medical advice.  Specific follow-up and call-back criteria were given for patient to follow-up or seek medical care more urgently if needed.   - Time spent in direct consultation with patient on phone: 12 minutes  Nobie Putnam, [facility] 03/07/2019, 4:05 PM

## 2019-04-27 IMAGING — MG MM DIGITAL SCREENING BILAT W/ TOMO W/ CAD
8 series · 8 of 24 positions shown · non-contrast
Comparison: Previous exam(s).

CLINICAL DATA: Screening.

EXAM:
DIGITAL SCREENING BILATERAL MAMMOGRAM WITH TOMO AND CAD

[R CC synth-2D]
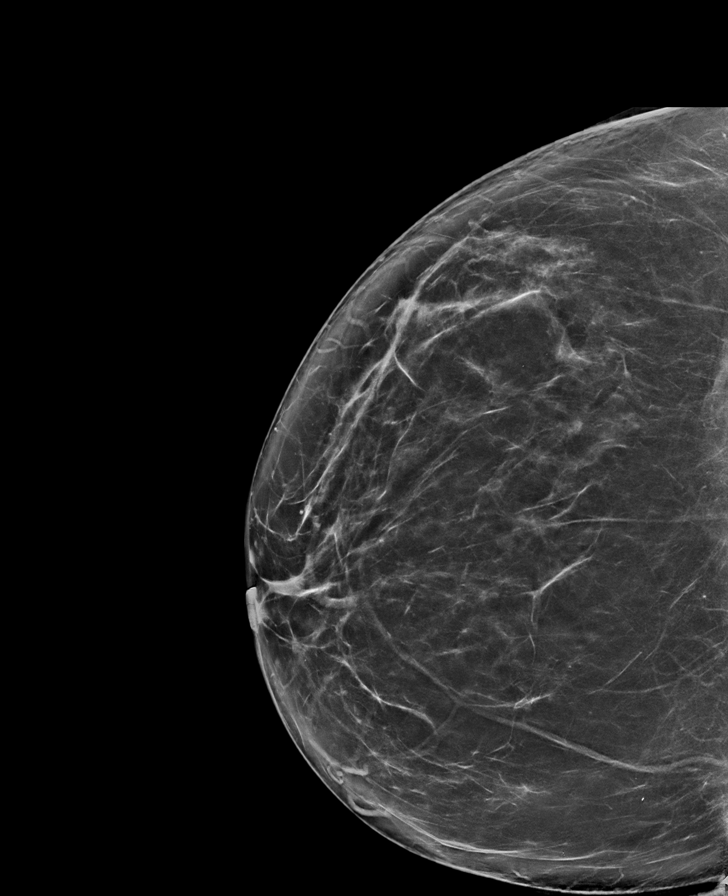

[L MLO synth-2D]
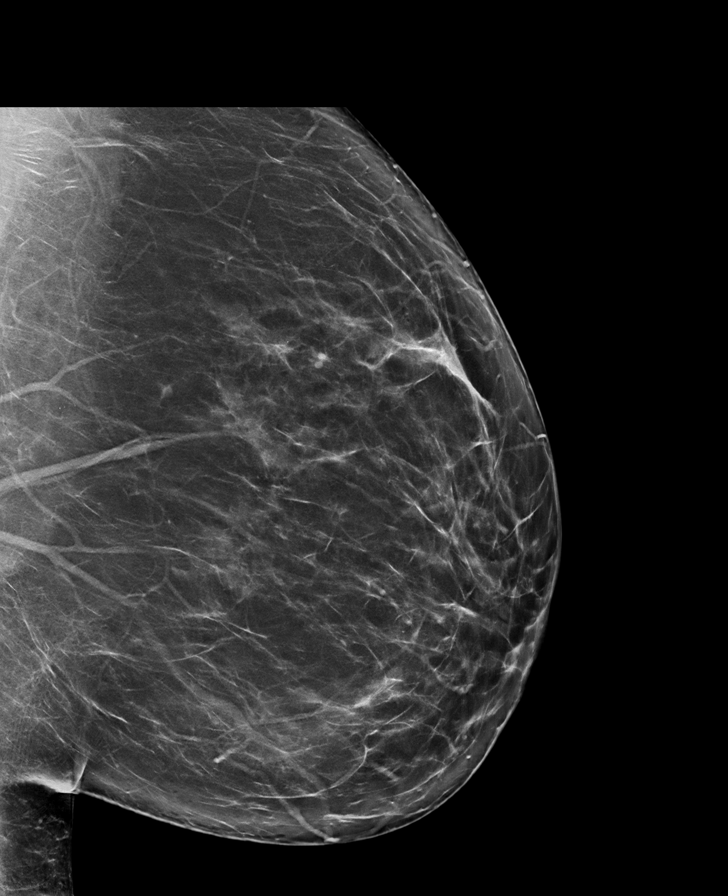

[R MLO synth-2D]
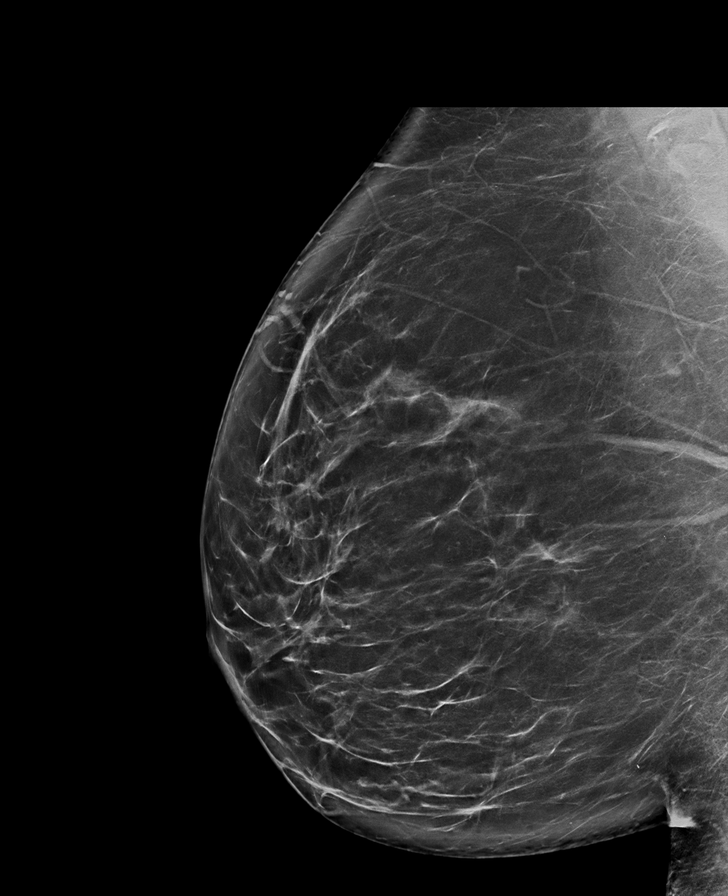

[L CC synth-2D]
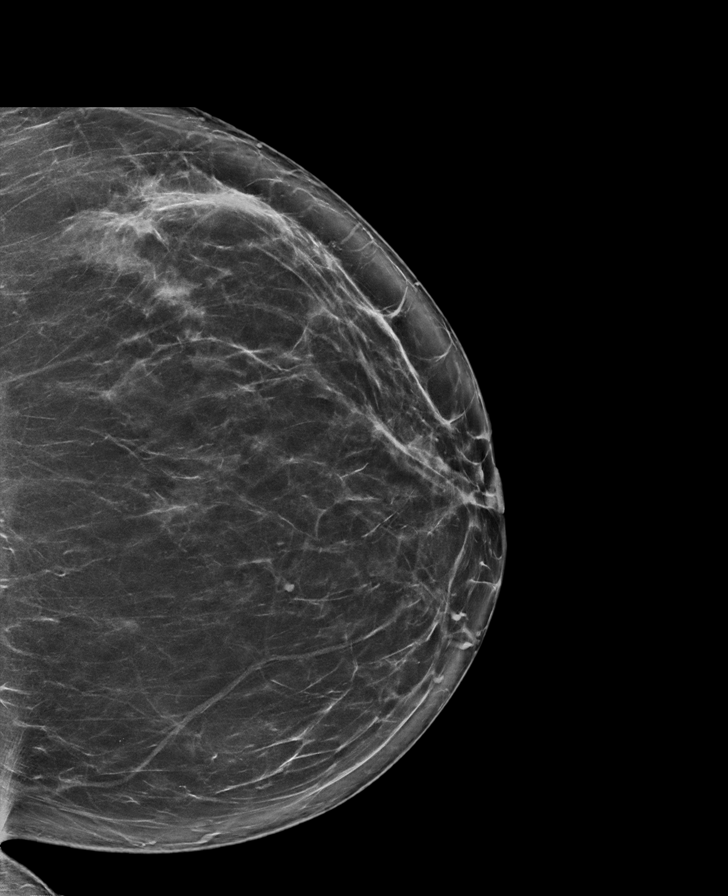

[L MLO tomo · tomo slice 45/90.0]
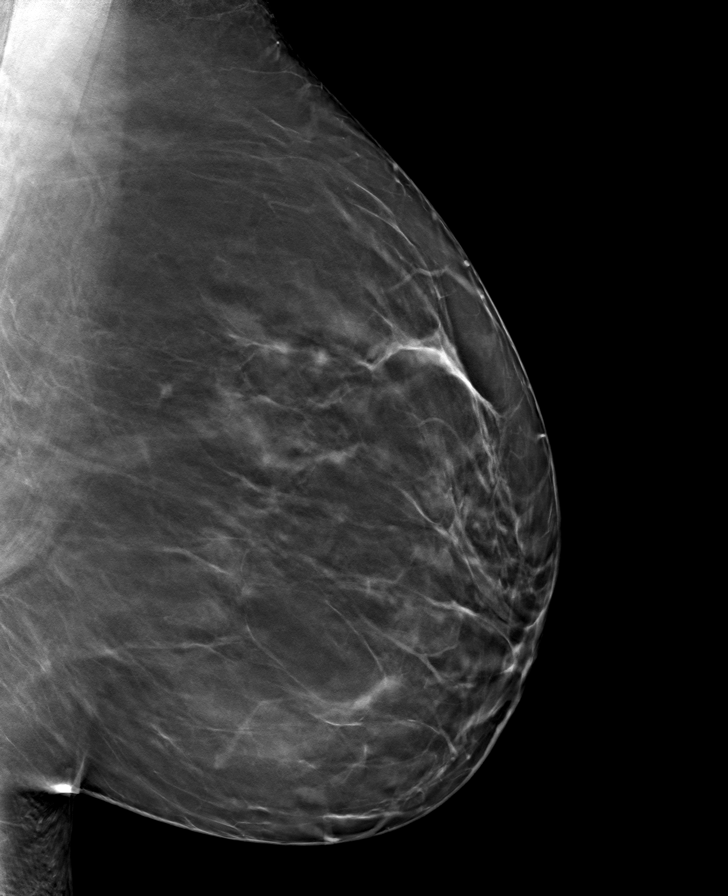

[R CC tomo · tomo slice 45/90.0]
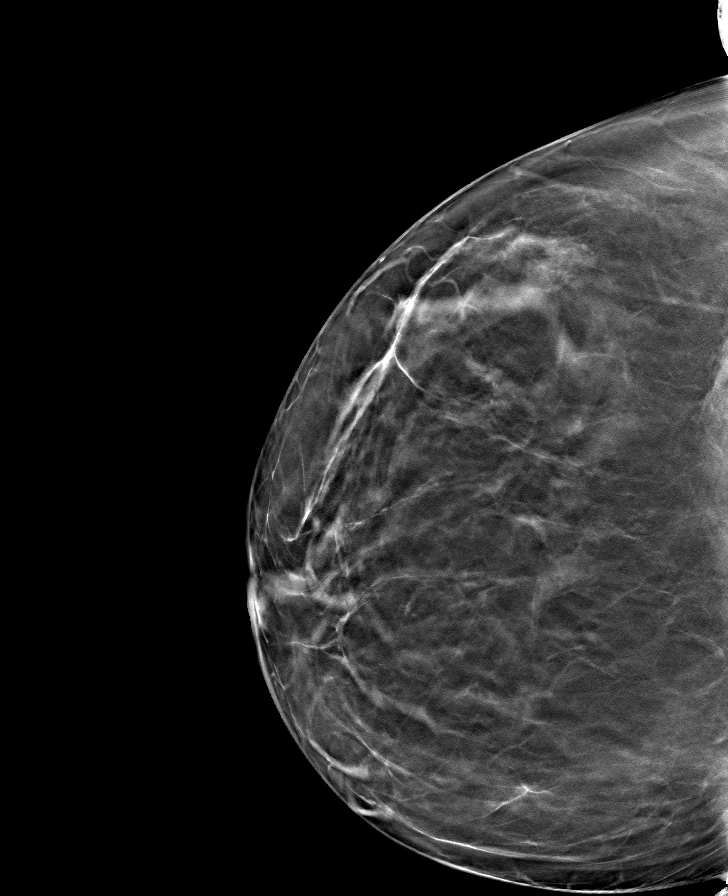

[L CC tomo · tomo slice 45/88.0]
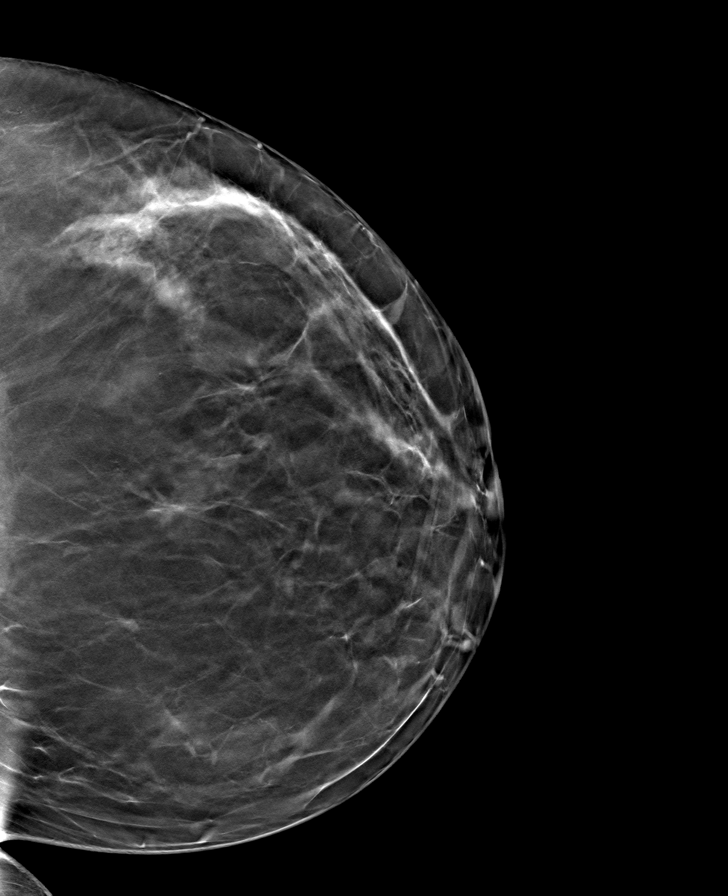

[R MLO tomo · tomo slice 47/94.0]
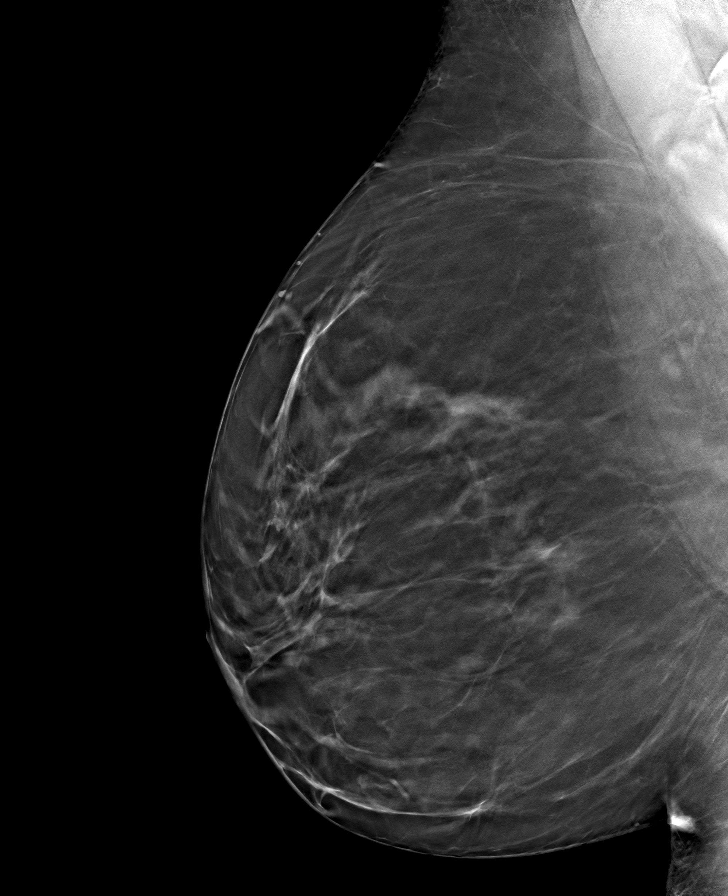

[8 of 24 positions shown; findings below may reference images not displayed]

ACR Breast Density Category b: There are scattered areas of
fibroglandular density.
FINDINGS: There are no findings suspicious for malignancy. Images were
processed with CAD.
IMPRESSION: No mammographic evidence of malignancy. A result letter of this
screening mammogram will be mailed directly to the patient.

RECOMMENDATION:
Screening mammogram in one year. (Code:[id number])

BI-RADS CATEGORY  1: Negative.

## 2019-04-29 ENCOUNTER — Other Ambulatory Visit: Payer: Self-pay | Admitting: Nurse Practitioner

## 2019-04-29 DIAGNOSIS — F325 Major depressive disorder, single episode, in full remission: Secondary | ICD-10-CM

## 2019-04-29 DIAGNOSIS — G8929 Other chronic pain: Secondary | ICD-10-CM

## 2019-08-19 ENCOUNTER — Other Ambulatory Visit: Payer: Self-pay | Admitting: Family Medicine

## 2019-08-19 DIAGNOSIS — F325 Major depressive disorder, single episode, in full remission: Secondary | ICD-10-CM

## 2019-10-23 ENCOUNTER — Other Ambulatory Visit: Payer: Self-pay | Admitting: Family Medicine

## 2019-11-05 ENCOUNTER — Other Ambulatory Visit: Payer: Self-pay | Admitting: Family Medicine

## 2019-11-20 ENCOUNTER — Other Ambulatory Visit: Payer: Self-pay | Admitting: Family Medicine

## 2019-11-20 DIAGNOSIS — G8929 Other chronic pain: Secondary | ICD-10-CM

## 2019-11-20 DIAGNOSIS — M5441 Lumbago with sciatica, right side: Secondary | ICD-10-CM

## 2019-12-09 ENCOUNTER — Other Ambulatory Visit: Payer: Self-pay | Admitting: Nurse Practitioner

## 2021-06-28 ENCOUNTER — Other Ambulatory Visit: Payer: Self-pay | Admitting: Family Medicine

## 2021-06-28 DIAGNOSIS — M5441 Lumbago with sciatica, right side: Secondary | ICD-10-CM

## 2021-06-28 DIAGNOSIS — G8929 Other chronic pain: Secondary | ICD-10-CM

## 2021-06-28 NOTE — Telephone Encounter (Signed)
effective 06/09/20

## 2022-11-23 ENCOUNTER — Other Ambulatory Visit: Admission: RE | Admit: 2022-11-23 | Payer: 59 | Source: Home / Self Care

## 2023-01-05 ENCOUNTER — Other Ambulatory Visit: Payer: Self-pay | Admitting: Family Medicine

## 2023-01-05 ENCOUNTER — Encounter: Payer: Self-pay | Admitting: Family Medicine

## 2023-01-05 DIAGNOSIS — M25552 Pain in left hip: Secondary | ICD-10-CM

## 2023-03-06 ENCOUNTER — Encounter: Payer: Self-pay | Admitting: Emergency Medicine

## 2023-03-06 ENCOUNTER — Ambulatory Visit
Admission: EM | Admit: 2023-03-06 | Discharge: 2023-03-06 | Disposition: A | Discharge status: Home / Self Care | Payer: 59

## 2023-03-06 DIAGNOSIS — M461 Sacroiliitis, not elsewhere classified: Secondary | ICD-10-CM

## 2023-03-06 MED ORDER — PREDNISONE 10 MG (21) PO TBPK
ORAL_TABLET | Freq: Every day | ORAL | 0 refills | Status: DC
Start: 2023-03-06 — End: 2023-11-27

## 2023-03-06 MED ORDER — KETOROLAC TROMETHAMINE 30 MG/ML IJ SOLN
30.0000 mg | Freq: Once | INTRAMUSCULAR | Status: AC
  Administered 2023-03-06: 30 mg via INTRAMUSCULAR

## 2023-03-06 NOTE — ED Triage Notes (Signed)
Patient states that 3 weeks ago she had been doing a lot cleaning and yard work and noticed pain in her lower back.  Patient reports ongoing lower back pain that radiates down her left upper leg.

## 2023-03-06 NOTE — Discharge Instructions (Addendum)
You were given a Toradol injection in clinic today. Do not take any over the counter NSAID's such as Advil, ibuprofen, Aleve, or naproxen for 24 hours.  You may take tylenol if needed Start prednisone taper Continue Tylenol, your previous prescription for tizanidine, heat, and physical therapy as needed Follow-up with PCP if symptoms do not improve Please go to the ER for any worsening symptoms

## 2023-03-06 NOTE — ED Provider Notes (Signed)
[facility]    CSN: [id number] Arrival date & time: 03/06/23  1211      History   Chief Complaint Chief Complaint  Patient presents with   Back Pain    HPI Morgan Hobbs is a 57 y.o. female presents for evaluation of back pain.  Patient reports 3 weeks of left-sided SI joint pain that began after doing some yard work and cleaning.  Reports the pain is constant and does radiate around to the top of her thigh.  She denies any numbness/tingling/weakness of her lower extremities, no bowel or bladder incontinence, no saddle paresthesia.  Does have a history of arthritis and spondylolisthesis but denies history of back surgeries.  She has been taking a previous prescription for tizanidine, Advil, Tylenol, going to physical therapy, doing acupuncture and dry needling with minimal improvement in her symptoms.   Back Pain   Past Medical History:  Diagnosis Date   Allergy    Anxiety    BRCA negative    Depression    GERD (gastroesophageal reflux disease)    Hemochromatosis    Spondylolisthesis    lumbar    Patient Active Problem List   Diagnosis Date Noted   Irritable bowel syndrome with both constipation and diarrhea 10/25/2018   Postmenopausal 05/23/2018   Hereditary hemochromatosis (HCC) 05/22/2018   Gastroesophageal reflux disease 05/22/2018   Osteoarthritis 05/22/2018   Syncope due to orthostatic hypotension 09/14/2017   Obesity (BMI 35.0-39.9 without comorbidity) 02/09/2017   S/P laparoscopic sleeve gastrectomy 02/08/2017   Allergic rhinitis due to animal hair and dander 10/10/2014   Chronic anxiety 10/10/2014   FH: breast cancer 10/10/2014   H/O: hysterectomy 10/10/2014    Past Surgical History:  Procedure Laterality Date   ABDOMINAL HYSTERECTOMY     CESAREAN SECTION     CHOLECYSTECTOMY     LAPAROSCOPIC GASTRIC SLEEVE RESECTION      OB History   No obstetric history on file.      Home Medications    Prior to Admission medications    Medication Sig Start Date End Date Taking? Authorizing Provider  buPROPion (WELLBUTRIN SR) 200 MG 12 hr tablet TAKE 1 TABLET BY MOUTH TWICE DAILY. TAKE WITH NALTREXONE 10/23/19  Yes Karamalegos, Netta Neat, DO  calcium citrate-vitamin D (CITRACAL+D) 315-200 MG-UNIT tablet Take by mouth.   Yes [provider]  FLUoxetine (PROZAC) 20 MG capsule TAKE 1 CAPSULE(20 MG) BY MOUTH DAILY 08/21/19  Yes Galen Manila, NP  gabapentin (NEURONTIN) 300 MG capsule TAKE 1 CAPSULE(300 MG) BY MOUTH AT BEDTIME 11/20/19  Yes Karamalegos, Netta Neat, DO  loratadine (CLARITIN) 10 MG tablet Take 10 mg by mouth daily.   Yes [provider]  magnesium oxide (MAG-OX) 400 MG tablet Take by mouth. 05/08/20  Yes [provider]  metFORMIN (GLUCOPHAGE-XR) 500 MG 24 hr tablet Take 1 tablet twice a day by oral route. 02/16/23  Yes [provider]  Multiple Vitamin (MULTIVITAMIN) capsule Take 1 capsule by mouth daily.   Yes [provider]  olopatadine (PATANOL) 0.1 % ophthalmic solution Apply to eye. 08/28/21  Yes [provider]  omeprazole (PRILOSEC) 20 MG capsule Take 1 capsule (20 mg total) by mouth daily. 05/22/18  Yes Galen Manila, NP  predniSONE (STERAPRED UNI-PAK 21 TAB) 10 MG (21) TBPK tablet Take by mouth daily. Take 6 tabs by mouth daily  for 1 day, then 5 tabs for 1 day, then 4 tabs for 1 day, then 3 tabs for 1 day, 2 tabs  for 1 day, then 1 tab by mouth daily for 1 days 03/06/23  Yes Radford Pax, NP  senna-docusate (SENOKOT-S) 8.6-50 MG tablet Take by mouth. 09/10/21  Yes [provider]  tiZANidine (ZANAFLEX) 4 MG tablet Take by mouth. 07/15/21  Yes [provider]  acetaminophen (TYLENOL) 650 MG CR tablet Take 650 mg by mouth every 8 (eight) hours as needed for pain.    [provider]  BD PEN NEEDLE NANO U/F 32G X 4 MM MISC U UTD ONCE D 03/27/18   [provider]  dicyclomine (BENTYL) 10 MG capsule Take 10 mg by  mouth as needed.     [provider]  Melatonin 3 MG TABS Take by mouth.    [provider]  methocarbamol (ROBAXIN) 500 MG tablet Take 1 tablet (500 mg total) by mouth 3 (three) times daily as needed for muscle spasms. 03/07/19   Karamalegos, Netta Neat, DO  Metoprolol Succinate 25 MG CS24 Take 1 capsule every day by oral route.    [provider]  midodrine (PROAMATINE) 5 MG tablet Take 1 tablet (5 mg total) by mouth 3 (three) times daily with meals. 04/21/18   Galen Manila, NP  naltrexone (DEPADE) 50 MG tablet TAKE 1/2 TABLET BY MOUTH 2 TIMES A DAY WITH BUPROPION 12/10/19   Karamalegos, Netta Neat, DO  progesterone (PROMETRIUM) 100 MG capsule Take 1 capsule by mouth daily.    [provider]    Family History Family History  Problem Relation Age of Onset   Cancer Mother    Breast cancer Mother 3       Ovarian 7   BRCA 1/2 Mother    Lupus Father    Nephrotic syndrome Father    Cancer Sister    BRCA 1/2 Sister    Breast cancer Sister 58   Stroke Maternal Grandmother    Atrial fibrillation Maternal Grandmother    Lung cancer Paternal Grandmother     Social History Social History   Tobacco Use   Smoking status: Never   Smokeless tobacco: Never  Vaping Use   Vaping Use: Never used  Substance Use Topics   Alcohol use: Yes    Comment: rarely (<5 drinks per year)   Drug use: Never     Allergies   Codeine, Pantoprazole, Decadron [dexamethasone], Erythromycin, Aspirin, and Penicillins   Review of Systems Review of Systems  Musculoskeletal:  Positive for back pain.     Physical Exam Triage Vital Signs ED Triage Vitals  Enc Vitals Group     BP 03/06/23 1234 108/76     Pulse Rate 03/06/23 1234 70     Resp 03/06/23 1234 15     Temp 03/06/23 1234 98.4 F (36.9 C)     Temp Source 03/06/23 1234 Oral     SpO2 03/06/23 1234 99 %     Weight 03/06/23 1229 235 lb 0.2 oz (106.6 kg)     Height 03/06/23 1229 5\' 8"  (1.727 m)      Head Circumference --      Peak Flow --      Pain Score 03/06/23 1229 7     Pain Loc --      Pain Edu? --      Excl. in GC? --    No data found.  Updated Vital Signs BP 108/76 (BP Location: Right Arm)   Pulse 70   Temp 98.4 F (36.9 C) (Oral)   Resp 15   Ht 5\' 8"  (  1.727 m)   Wt 235 lb 0.2 oz (106.6 kg)   SpO2 99%   BMI 35.73 kg/m   Visual Acuity Right Eye Distance:   Left Eye Distance:   Bilateral Distance:    Right Eye Near:   Left Eye Near:    Bilateral Near:     Physical Exam Vitals and nursing note reviewed.  Constitutional:      General: She is not in acute distress.    Appearance: Normal appearance. She is not ill-appearing, toxic-appearing or diaphoretic.  HENT:     Head: Normocephalic and atraumatic.  Eyes:     Pupils: Pupils are equal, round, and reactive to light.  Cardiovascular:     Rate and Rhythm: Normal rate.  Pulmonary:     Effort: Pulmonary effort is normal.  Musculoskeletal:     Cervical back: Normal.     Thoracic back: Normal.     Lumbar back: Tenderness present. No swelling, edema, deformity, signs of trauma, lacerations, spasms or bony tenderness. Normal range of motion. Negative right straight leg raise test and negative left straight leg raise test. No scoliosis.     Comments: Point tenderness to SI joint on left.  Strength is 5 out of 5 bilateral lower extremities  Skin:    General: Skin is warm and dry.  Neurological:     General: No focal deficit present.     Mental Status: She is alert and oriented to person, place, and time.  Psychiatric:        Behavior: Behavior normal.      UC Treatments / Results  Labs (all labs ordered are listed, but only abnormal results are displayed) Labs Reviewed - No data to display  Comprehensive Metabolic Panel Order: [id number] Component Ref Range & Units 5 mo ago  Sodium 135 - 145 mmol/L 140  Potassium 3.4 - 4.8 mmol/L 4.1  Chloride 98 - 107 mmol/L 106  CO2 20.0 - 31.0 mmol/L 28.0   Anion Gap 5 - 14 mmol/L 6  BUN 9 - 23 mg/dL 13  Creatinine 4.09 - 8.11 mg/dL 9.14  BUN/Creatinine Ratio 15  eGFR CKD-EPI (2021) Female >=60 mL/min/1.79m2 83  Comment: eGFR calculated with CKD-EPI 2021 equation in accordance with [employer] and [employer] of Nephrology Task Force recommendations.  Glucose 70 - 179 mg/dL 782  Calcium 8.7 - 95.6 mg/dL 9.3  Albumin 3.4 - 5.0 g/dL 4.0  Total Protein 5.7 - 8.2 g/dL 7.2  Total Bilirubin 0.3 - 1.2 mg/dL 0.3  AST <=21 U/L 30  ALT 10 - 49 U/L 29  Alkaline Phosphatase 46 - 116 U/L 131 High   Resulting Agency [facility]   Specimen Collected: 09/09/22 14:13    EKG   Radiology No results found.  Procedures Procedures (including critical care time)  Medications Ordered in UC Medications  ketorolac (TORADOL) 30 MG/ML injection 30 mg (30 mg Intramuscular Given 03/06/23 1313)    Initial Impression / Assessment and Plan / UC Course  I have reviewed the triage vital signs and the nursing notes.  Pertinent labs & imaging results that were available during my care of the patient were reviewed by me and considered in my medical decision making (see chart for details).     Reviewed past progress notes and labs.  Discussed symptoms and exam with patient.  No red flags. Patient given Toradol injection in clinic.  She was monitored for 10 minutes after injection with no reaction noted and tolerated well.  She  was instructed no NSAIDs for 24 hours and she verbalized understanding Prednisone taper.  Patient has had prednisone in the past without any side effects.  This was discussed in length with patient Continue tizanidine, physical therapy, heat as needed Patient to follow-up with PCP if symptoms do not improve ER precautions reviewed and patient verbalized understanding Final Clinical Impressions(s) / UC Diagnoses   Final diagnoses:  SI (sacroiliac) joint inflammation (HCC)      Discharge Instructions      You were given a Toradol injection in clinic today. Do not take any over the counter NSAID's such as Advil, ibuprofen, Aleve, or naproxen for 24 hours.  You may take tylenol if needed Start prednisone taper Continue Tylenol, your previous prescription for tizanidine, heat, and physical therapy as needed Follow-up with PCP if symptoms do not improve Please go to the ER for any worsening symptoms     ED Prescriptions     Medication Sig Dispense Auth. Provider   predniSONE (STERAPRED UNI-PAK 21 TAB) 10 MG (21) TBPK tablet Take by mouth daily. Take 6 tabs by mouth daily  for 1 day, then 5 tabs for 1 day, then 4 tabs for 1 day, then 3 tabs for 1 day, 2 tabs for 1 day, then 1 tab by mouth daily for 1 days 21 tablet Radford Pax, NP      PDMP not reviewed this encounter.   Radford Pax, NP 03/06/23 1327

## 2023-03-23 ENCOUNTER — Ambulatory Visit
Admission: RE | Admit: 2023-03-23 | Discharge: 2023-03-23 | Disposition: A | Discharge status: Home / Self Care | Payer: 59 | Source: Ambulatory Visit | Attending: Internal Medicine | Admitting: Internal Medicine

## 2023-03-23 DIAGNOSIS — M25552 Pain in left hip: Secondary | ICD-10-CM | POA: Diagnosis present

## 2023-07-18 ENCOUNTER — Other Ambulatory Visit: Payer: Self-pay | Admitting: Family Medicine

## 2023-07-18 DIAGNOSIS — M7989 Other specified soft tissue disorders: Secondary | ICD-10-CM

## 2023-07-18 DIAGNOSIS — R29898 Other symptoms and signs involving the musculoskeletal system: Secondary | ICD-10-CM

## 2023-07-20 ENCOUNTER — Ambulatory Visit
Admission: RE | Admit: 2023-07-20 | Discharge: 2023-07-20 | Disposition: A | Discharge status: Home / Self Care | Payer: 59 | Source: Ambulatory Visit | Attending: Family Medicine | Admitting: Family Medicine

## 2023-07-20 DIAGNOSIS — M7989 Other specified soft tissue disorders: Secondary | ICD-10-CM | POA: Insufficient documentation

## 2023-07-22 ENCOUNTER — Ambulatory Visit: Payer: 59

## 2023-07-26 ENCOUNTER — Ambulatory Visit
Admission: RE | Admit: 2023-07-26 | Discharge: 2023-07-26 | Disposition: A | Discharge status: Home / Self Care | Payer: 59 | Source: Ambulatory Visit | Attending: Family Medicine | Admitting: Family Medicine

## 2023-07-26 DIAGNOSIS — R29898 Other symptoms and signs involving the musculoskeletal system: Secondary | ICD-10-CM | POA: Insufficient documentation

## 2023-08-16 ENCOUNTER — Other Ambulatory Visit: Payer: Self-pay | Admitting: Family Medicine

## 2023-08-16 DIAGNOSIS — R55 Syncope and collapse: Secondary | ICD-10-CM

## 2023-08-16 DIAGNOSIS — R42 Dizziness and giddiness: Secondary | ICD-10-CM

## 2023-08-18 ENCOUNTER — Ambulatory Visit
Admission: RE | Admit: 2023-08-18 | Discharge: 2023-08-18 | Disposition: A | Discharge status: Home / Self Care | Payer: 59 | Source: Ambulatory Visit | Attending: Family Medicine | Admitting: Family Medicine

## 2023-08-18 DIAGNOSIS — R55 Syncope and collapse: Secondary | ICD-10-CM | POA: Diagnosis present

## 2023-08-18 DIAGNOSIS — R42 Dizziness and giddiness: Secondary | ICD-10-CM | POA: Diagnosis present

## 2023-08-18 MED ORDER — GADOBUTROL 1 MMOL/ML IV SOLN
10.0000 mL | Freq: Once | INTRAVENOUS | Status: AC | PRN
  Administered 2023-08-18: 10 mL via INTRAVENOUS

## 2023-11-27 ENCOUNTER — Emergency Department: Payer: 59

## 2023-11-27 ENCOUNTER — Emergency Department
Admission: EM | Admit: 2023-11-27 | Discharge: 2023-11-27 | Disposition: A | Discharge status: Home / Self Care | Payer: 59 | Attending: Emergency Medicine | Admitting: Emergency Medicine

## 2023-11-27 ENCOUNTER — Ambulatory Visit
Admission: EM | Admit: 2023-11-27 | Discharge: 2023-11-27 | Disposition: A | Discharge status: Home / Self Care | Payer: 59 | Attending: Emergency Medicine | Admitting: Emergency Medicine

## 2023-11-27 ENCOUNTER — Other Ambulatory Visit: Payer: Self-pay

## 2023-11-27 DIAGNOSIS — S46912A Strain of unspecified muscle, fascia and tendon at shoulder and upper arm level, left arm, initial encounter: Secondary | ICD-10-CM | POA: Diagnosis not present

## 2023-11-27 DIAGNOSIS — R079 Chest pain, unspecified: Secondary | ICD-10-CM | POA: Diagnosis not present

## 2023-11-27 DIAGNOSIS — M79602 Pain in left arm: Secondary | ICD-10-CM

## 2023-11-27 DIAGNOSIS — M79622 Pain in left upper arm: Secondary | ICD-10-CM | POA: Diagnosis not present

## 2023-11-27 DIAGNOSIS — X58XXXA Exposure to other specified factors, initial encounter: Secondary | ICD-10-CM | POA: Diagnosis not present

## 2023-11-27 DIAGNOSIS — R0602 Shortness of breath: Secondary | ICD-10-CM | POA: Diagnosis not present

## 2023-11-27 DIAGNOSIS — T148XXA Other injury of unspecified body region, initial encounter: Secondary | ICD-10-CM

## 2023-11-27 LAB — BASIC METABOLIC PANEL
Anion gap: 11 (ref 5–15)
BUN: 14 mg/dL (ref 6–20)
CO2: 22 mmol/L (ref 22–32)
Calcium: 9 mg/dL (ref 8.9–10.3)
Chloride: 102 mmol/L (ref 98–111)
Creatinine, Ser: 1.04 mg/dL — ABNORMAL HIGH (ref 0.44–1.00)
GFR, Estimated: >60 mL/min (ref >60)
Glucose, Bld: 154 mg/dL — ABNORMAL HIGH (ref 70–99)
Potassium: 4.7 mmol/L (ref 3.5–5.1)
Sodium: 135 mmol/L (ref 135–145)

## 2023-11-27 LAB — CBC
HCT: 28.8 % — ABNORMAL LOW (ref 36.0–46.0)
Hemoglobin: 8.8 g/dL — ABNORMAL LOW (ref 12.0–15.0)
MCH: 24.2 pg — ABNORMAL LOW (ref 26.0–34.0)
MCHC: 30.6 g/dL (ref 30.0–36.0)
MCV: 79.1 fL — ABNORMAL LOW (ref 80.0–100.0)
Platelets: 485 10*3/uL — ABNORMAL HIGH (ref 150–400)
RBC: 3.64 MIL/uL — ABNORMAL LOW (ref 3.87–5.11)
RDW: 19.3 % — ABNORMAL HIGH (ref 11.5–15.5)
WBC: 9.4 10*3/uL (ref 4.0–10.5)
nRBC: 0 % (ref 0.0–0.2)

## 2023-11-27 LAB — TROPONIN I (HIGH SENSITIVITY): Troponin I (High Sensitivity): 3 ng/L (ref <18)

## 2023-11-27 MED ORDER — METHOCARBAMOL 750 MG PO TABS: 750.0000 mg | ORAL_TABLET | Freq: Three times a day (TID) | ORAL | 0 refills | Status: AC

## 2023-11-27 NOTE — ED Triage Notes (Signed)
Pt comes with left arm pain and rule out DVT. Pt stats pain and swelling for about week. Pt denies any hx of this.

## 2023-11-27 NOTE — Discharge Instructions (Addendum)
You have been diagnosed with muscle strain, left arm pain.  Please take meloxicam 3 times per day after main meals, continue follow-up with your PCP.  Come back to ED if you have any new symptoms or worsening symptoms.

## 2023-11-27 NOTE — ED Triage Notes (Signed)
Pt is with her husband  Pt c/o bruise along left inner arm/armpit x1week  Pt states that it causes left arm pain from mid forearm to armpit.   Pt states that the bruise has gone away.  Pt denies any new physical activity or injury and states that she woke up with the bruise.   Pt has hemachromatosis and states that her hemoglobin dropped to 8. Pt had blood transfusions done last month.   Pt states that she had the transfusion on the left arm as well and the pain felt like phlebitis.  Pt states that the pain has not gone away and it is still as severe as when it started a week ago.

## 2023-11-27 NOTE — ED Provider Notes (Signed)
[facility] Provider Note    Event Date/Time   First MD Initiated Contact with Patient 11/27/23 1612     (approximate)   History   Arm Pain   HPI  Morgan Hobbs is a 58 y.o. female presents today with history of left arm pain.  Patient states presence of ecchymosis in the area before.  Patient states has a known anemia and got iron transfusion in that arm November 15, 2023.  Pain increased with flexion of the arm.       Physical Exam   Triage Vital Signs: ED Triage Vitals  Encounter Vitals Group     BP 11/27/23 1425 123/79     Systolic BP Percentile --      Diastolic BP Percentile --      Pulse Rate 11/27/23 1425 72     Resp 11/27/23 1425 18     Temp 11/27/23 1425 98 F (36.7 C)     Temp src --      SpO2 11/27/23 1425 98 %     Weight 11/27/23 1423 237 lb (107.5 kg)     Height 11/27/23 1423 5\' 8"  (1.727 m)     Head Circumference --      Peak Flow --      Pain Score 11/27/23 1423 4     Pain Loc --      Pain Education --      Exclude from Growth Chart --     Most recent vital signs: Vitals:   11/27/23 1425  BP: 123/79  Pulse: 72  Resp: 18  Temp: 98 F (36.7 C)  SpO2: 98%     Constitutional: Alert nondistressed Eyes: Conjunctivae are normal.  Head: Atraumatic. Nose: No congestion/rhinnorhea. Mouth/Throat: Mucous membranes are moist.   Neck: Painless ROM.  Cardiovascular:   Good peripheral circulation. Respiratory: Normal respiratory effort.  No retractions.  No wheezing Gastrointestinal: Soft and nontender.  Musculoskeletal:  no deformity Left upper extremity: Skin is intact, no scars, no bruising, no hematoma or erythema. Tender to palpation in the medial side of the humerus.  Sensation decreased, strain 4/5.  Neurologic:  MAE spontaneously. No gross focal neurologic deficits are appreciated.  Skin:  Skin is warm, dry and intact. No rash noted. Psychiatric: Mood and affect are normal. Speech and behavior are normal.    ED  Results / Procedures / Treatments   Labs (all labs ordered are listed, but only abnormal results are displayed) Labs Reviewed  BASIC METABOLIC PANEL - Abnormal; Notable for the following components:      Result Value   Glucose, Bld 154 (*)    Creatinine, Ser 1.04 (*)    All other components within normal limits  CBC - Abnormal; Notable for the following components:   RBC 3.64 (*)    Hemoglobin 8.8 (*)    HCT 28.8 (*)    MCV 79.1 (*)    MCH 24.2 (*)    RDW 19.3 (*)    Platelets 485 (*)    All other components within normal limits  TROPONIN I (HIGH SENSITIVITY)     EKG See physician read    RADIOLOGY I independently reviewed and interpreted imaging and agree with radiologists findings.      PROCEDURES:  Critical Care performed:   Procedures   MEDICATIONS ORDERED IN ED: Medications - No data to display   IMPRESSION / MDM / ASSESSMENT AND PLAN / ED COURSE  I reviewed the triage vital signs and the nursing notes.  Differential diagnosis includes, but is not limited to, DVT, muscle strain, cellulitis  Patient's presentation is most consistent with acute complicated illness / injury requiring diagnostic workup.   Patient's diagnosis is consistent with muscle strain, anemia. I independently reviewed and interpreted imaging and agree with radiologists findings. Labs are rea reassuring. I did review the patient's allergies and medications. Patient will be discharged home with prescriptions for meloxicam. Patient is to follow up with PCP as needed or otherwise directed. Patient is given ED precautions to return to the ED for any worsening or new symptoms. Discussed plan of care with patient, answered all of patient's questions, Patient agreeable to plan of care. Advised patient to take medications according to the instructions on the label. Discussed possible side effects of new medications. Patient verbalized understanding. Clinical Course as of 11/27/23 1719  Sun Nov 27, 2023  1658 DG Chest 2 View No acute intrathoracic process [AE]  1658 US Venous Img Upper Uni Left No evidence of DVT in the left upper extremity [AE]    Clinical Course User Index [AE] Gladys Damme, PA-C     FINAL CLINICAL IMPRESSION(S) / ED DIAGNOSES   Final diagnoses:  Left arm pain  Muscle strain     Rx / DC Orders   ED Discharge Orders          Ordered    methocarbamol (ROBAXIN-750) 750 MG tablet  3 times daily        11/27/23 1717             Note:  This document was prepared using Dragon voice recognition software and may include unintentional dictation errors.   Gladys Damme, PA-C 11/27/23 1720    Janith Lima, MD 11/27/23 [phone]

## 2023-11-27 NOTE — ED Provider Notes (Signed)
HPI  SUBJECTIVE:  Morgan Hobbs is a 58 y.o. female who presents with left upper arm pain inferior to the left bicep that radiates to her forearm starting shortly after having a blood draw.  Patient states she woke up with it.  She states that she woke up with a brownish discoloration, but no ecchymosis in this area.  States it felt like a phlebitis, but it is getting worse.  No erythema, palpable cord, swelling, fevers.  She reports a few days of intermittent substernal chest pain described as tightness without any exertional component to it and shortness of breath that is getting worse over the past few days.  She has had chest pain like this before when her hemoglobin acutely drops.  She denies coughing, wheezing, pleuritic pain, hemoptysis.  She reports left arm weakness due to the pain.  She has been taking Tylenol without improvement in her symptoms.  Symptoms are worse with palpitation and bicep use.  She denies other trauma to the area.  She has a past medical history of hemochromatosis but also anemia requiring transfusion.  She is status post hysterectomy and gastric sleeve resection and is unable to take NSAIDs.  She is on an estrogen patch.  No history of PE, DVT. PCP: [facility] [place].  Past Medical History:  Diagnosis Date   Allergy    Anxiety    BRCA negative    Depression    GERD (gastroesophageal reflux disease)    Hemochromatosis    Spondylolisthesis    lumbar    Past Surgical History:  Procedure Laterality Date   ABDOMINAL HYSTERECTOMY     CESAREAN SECTION     CHOLECYSTECTOMY     LAPAROSCOPIC GASTRIC SLEEVE RESECTION      Family History  Problem Relation Age of Onset   Cancer Mother    Breast cancer Mother 33       Ovarian 72   BRCA 1/2 Mother    Lupus Father    Nephrotic syndrome Father    Cancer Sister    BRCA 1/2 Sister    Breast cancer Sister 50   Stroke Maternal Grandmother    Atrial fibrillation Maternal Grandmother    Lung cancer Paternal  Grandmother     Social History   Tobacco Use   Smoking status: Never   Smokeless tobacco: Never  Vaping Use   Vaping status: Never Used  Substance Use Topics   Alcohol use: Not Currently    Comment: rarely (<5 drinks per year)   Drug use: Never    No current facility-administered medications for this encounter.  Current Outpatient Medications:    acetaminophen (TYLENOL) 650 MG CR tablet, Take 650 mg by mouth every 8 (eight) hours as needed for pain., Disp: , Rfl:    BD PEN NEEDLE NANO U/F 32G X 4 MM MISC, U UTD ONCE D, Disp: , Rfl: 1   buPROPion (WELLBUTRIN SR) 200 MG 12 hr tablet, TAKE 1 TABLET BY MOUTH TWICE DAILY. TAKE WITH NALTREXONE, Disp: 60 tablet, Rfl: 5   calcium citrate-vitamin D (CITRACAL+D) 315-200 MG-UNIT tablet, Take by mouth., Disp: , Rfl:    dicyclomine (BENTYL) 10 MG capsule, Take 10 mg by mouth as needed. , Disp: , Rfl:    gabapentin (NEURONTIN) 300 MG capsule, TAKE 1 CAPSULE(300 MG) BY MOUTH AT BEDTIME, Disp: 90 capsule, Rfl: 1   loratadine (CLARITIN) 10 MG tablet, Take 10 mg by mouth daily., Disp: , Rfl:    magnesium oxide (MAG-OX) 400 MG tablet, Take  by mouth., Disp: , Rfl:    metFORMIN (GLUCOPHAGE-XR) 500 MG 24 hr tablet, Take 1 tablet twice a day by oral route., Disp: , Rfl:    methocarbamol (ROBAXIN) 500 MG tablet, Take 1 tablet (500 mg total) by mouth 3 (three) times daily as needed for muscle spasms., Disp: 30 tablet, Rfl: 2   Metoprolol Succinate 25 MG CS24, Take 1 capsule every day by oral route., Disp: , Rfl:    Multiple Vitamin (MULTIVITAMIN) capsule, Take 1 capsule by mouth daily., Disp: , Rfl:    naltrexone (DEPADE) 50 MG tablet, TAKE 1/2 TABLET BY MOUTH 2 TIMES A DAY WITH BUPROPION, Disp: 30 tablet, Rfl: 2   olopatadine (PATANOL) 0.1 % ophthalmic solution, Apply to eye., Disp: , Rfl:    omeprazole (PRILOSEC) 20 MG capsule, Take 1 capsule (20 mg total) by mouth daily., Disp: 90 capsule, Rfl: 1   progesterone (PROMETRIUM) 100 MG capsule, Take 1 capsule  by mouth daily., Disp: , Rfl:    senna-docusate (SENOKOT-S) 8.6-50 MG tablet, Take by mouth., Disp: , Rfl:    tiZANidine (ZANAFLEX) 4 MG tablet, Take by mouth., Disp: , Rfl:    FLUoxetine (PROZAC) 20 MG capsule, TAKE 1 CAPSULE(20 MG) BY MOUTH DAILY, Disp: 30 capsule, Rfl: 2   Melatonin 3 MG TABS, Take by mouth., Disp: , Rfl:    midodrine (PROAMATINE) 5 MG tablet, Take 1 tablet (5 mg total) by mouth 3 (three) times daily with meals., Disp: 90 tablet, Rfl: 1  Allergies  Allergen Reactions   Codeine Shortness Of Breath   Pantoprazole Other (See Comments) and Swelling   Decadron [Dexamethasone] Other (See Comments)    Hallucinations   Erythromycin Hives   Aspirin Itching, Rash and Other (See Comments)    Reaction: Purpura (intolerance)   Penicillins Rash     ROS  As noted in HPI.   Physical Exam  BP 105/74 (BP Location: Left Arm)   Pulse 77   Temp 98.7 F (37.1 C) (Oral)   Ht 5\' 8"  (1.727 m)   Wt 107.5 kg   SpO2 97%   BMI 36.04 kg/m   Constitutional: Well developed, well nourished, no acute distress Eyes:  EOMI, conjunctiva normal bilaterally HENT: Normocephalic, atraumatic,mucus membranes moist Respiratory: Normal inspiratory effort lungs clear bilaterally Cardiovascular: Normal rate, regular rhythm, no murmurs rubs or gallops.  No other chest wall tenderness. GI: nondistended skin: No rash, skin intact Musculoskeletal: no deformities positive tenderness inferior to the biceps along the brachial vein.  Maximal proximally near the axilla and extends down to the [facility] fossa.  No erythema, swelling, induration, palpable cord.  Neurologic: Alert & oriented x 3, no focal neuro deficits Psychiatric: Speech and behavior appropriate   ED Course   Medications - No data to display  Orders Placed This Encounter  Procedures   ED EKG    Standing Status:   Standing    Number of Occurrences:   1    Reason for Exam:   Chest Pain    No results found for this or any previous  visit (from the past 24 hours). No results found.  ED Clinical Impression  1. Left upper arm pain   2. Chest pain, unspecified type   3. Shortness of breath      ED Assessment/Plan     EKG: Normal sinus rhythm, rate 66.  Normal axis, normal intervals.  No hypertrophy.  Nonspecific ST changes.  No ST changes consistent with ischemia.  No previous EKG for comparison.  Patient  symptomatic while EKG was obtained.  I am primarily concerned about upper extremity DVT with the recent blood draw and exogenous estrogen.  Unfortunately, we do not have ultrasound here available today to evaluate for this.  I am concerned that she may have the beginnings of a PE.  Her chest pain and shortness of breath could also be due to acute anemia.  However, she is hemodynamically stable, not tachycardic, satting 97% on room air, denies any pleuritic pain, and has a normal EKG.  Transferring to the emergency department via private vehicle to evaluate for upper extremity DVT.  Discussed rationale for transfer to the emergency department with patient.  She and her partner agree to go.  Gave report to charge nurse Morrie Sheldon at at the [facility] ED.   No orders of the defined types were placed in this encounter.     *This clinic note was created using Dragon dictation software. Therefore, there may be occasional mistakes despite careful proofreading.  ?    Domenick Gong, MD 11/27/23 1346

## 2023-11-27 NOTE — Discharge Instructions (Signed)
I am concerned that you have a DVT/blood clot in your arm.  I am also concerned with your your chest pain and shortness of breath, you are having acute symptomatic anemia again.  Your EKG was normal.  Please go immediately to the emergency department.

## 2024-04-24 ENCOUNTER — Other Ambulatory Visit: Payer: Self-pay | Admitting: Family Medicine

## 2024-04-24 DIAGNOSIS — Z1231 Encounter for screening mammogram for malignant neoplasm of breast: Secondary | ICD-10-CM

## 2024-05-09 ENCOUNTER — Other Ambulatory Visit: Payer: Self-pay | Admitting: Medical Genetics

## 2024-05-10 ENCOUNTER — Other Ambulatory Visit
Admission: RE | Admit: 2024-05-10 | Discharge: 2024-05-10 | Disposition: A | Discharge status: Home / Self Care | Payer: Self-pay | Source: Ambulatory Visit | Attending: Medical Genetics | Admitting: Medical Genetics

## 2024-05-16 ENCOUNTER — Ambulatory Visit
Admission: RE | Admit: 2024-05-16 | Discharge: 2024-05-16 | Disposition: A | Discharge status: Home / Self Care | Source: Ambulatory Visit | Attending: Family Medicine | Admitting: Family Medicine

## 2024-05-16 DIAGNOSIS — Z1231 Encounter for screening mammogram for malignant neoplasm of breast: Secondary | ICD-10-CM | POA: Diagnosis present

## 2024-05-22 LAB — GENECONNECT MOLECULAR SCREEN: Genetic Analysis Overall Interpretation: NEGATIVE

## 2024-10-10 ENCOUNTER — Ambulatory Visit: Admitting: Sleep Medicine

## 2024-10-10 ENCOUNTER — Encounter: Payer: Self-pay | Admitting: Sleep Medicine

## 2024-10-10 VITALS — BP 110/70 | HR 67 | Temp 97.7°F | Ht 68.0 in | Wt 240.2 lb

## 2024-10-10 DIAGNOSIS — E669 Obesity, unspecified: Secondary | ICD-10-CM

## 2024-10-10 DIAGNOSIS — G4733 Obstructive sleep apnea (adult) (pediatric): Secondary | ICD-10-CM

## 2024-10-10 NOTE — Progress Notes (Signed)
 Name:Morgan Hobbs MRN: [id number] DOB: 10-01-1966   CHIEF COMPLAINT:  ESTABLISH CARE FOR OSA   HISTORY OF PRESENT ILLNESS: Ms. Morgan Hobbs is a 58 y.o. w/ a h/o OSA, prediabetes, GERD, hereditary hemochromatosis and obesity who presents to establish care for OSA. Reports that she was initially diagnosed with OSA several years ago and was subsequently started on CPAP therapy. Reports using CPAP therapy every night. CPAP compliance is currently unavailable. Reports that her CPAP device recently started making loud noises. Reports feeling more refreshed upon awakening with CPAP therapy. Reports nocturnal awakenings due to unclear reasons, however does not have difficulty falling back to sleep. Denies any significant weight changes. Admits to dry mouth, night sweats and morning headaches. Denies RLS symptoms, dream enactment, cataplexy, hypnagogic or hypnapompic hallucinations. Reports a family history of sleep apnea. Denies drowsy driving. Drinks 1 soda or coffee daily, denies alcohol, tobacco or illicit drug use.   Bedtime 7:30-8 pm Sleep onset 1-2 hours Rise time 7:30 am   EPWORTH SLEEP SCORE 17    10/10/2024    2:00 PM  Results of the Epworth flowsheet  Sitting and reading 3  Watching TV 3  Sitting, inactive in a public place (e.g. a theatre or a meeting) 2  As a passenger in a car for an hour without a break 3  Lying down to rest in the afternoon when circumstances permit 3  Sitting and talking to someone 1  Sitting quietly after a lunch without alcohol 2  In a car, while stopped for a few minutes in traffic 0  Total score 17    PAST MEDICAL HISTORY :   has a past medical history of Allergy, Anxiety, BRCA negative, Depression, GERD (gastroesophageal reflux disease), Hemochromatosis, and Spondylolisthesis.  has a past surgical history that includes Cholecystectomy; Abdominal hysterectomy; Laparoscopic gastric sleeve resection; and Cesarean section. Prior to Admission  medications   Medication Sig Start Date End Date Taking? Authorizing Provider  acetaminophen  (TYLENOL ) 650 MG CR tablet Take 650 mg by mouth every 8 (eight) hours as needed for pain.   Yes [provider]  BD PEN NEEDLE NANO U/F 32G X 4 MM MISC U UTD ONCE D 03/27/18  Yes [provider]  buPROPion  (WELLBUTRIN  XL) 300 MG 24 hr tablet Take 300 mg by mouth daily. 09/27/24  Yes [provider]  calcium carbonate (OS-CAL) 1250 (500 Ca) MG chewable tablet Chew 1 tablet by mouth daily. 08/08/18  Yes [provider]  calcium citrate-vitamin D (CITRACAL+D) 315-200 MG-UNIT tablet Take by mouth.   Yes [provider]  Cholecalciferol 250 MCG (10000 UT) CAPS Take 10,000 Units by mouth.   Yes [provider]  desvenlafaxine (PRISTIQ) 25 MG 24 hr tablet Take 25 mg by mouth daily. 02/02/22  Yes [provider]  dicyclomine (BENTYL) 10 MG capsule Take 10 mg by mouth as needed.    Yes [provider]  docusate sodium (COLACE) 100 MG capsule Take 100 mg by mouth daily as needed for mild constipation. 06/27/24  Yes [provider]  estradiol (VIVELLE-DOT) 0.025 MG/24HR Place 1 patch onto the skin 2 (two) times a week.   Yes [provider]  famotidine  (PEPCID ) 40 MG tablet Take 40 mg by mouth 2 (two) times daily. 05/26/21  Yes [provider]  Ferrous Sulfate (IRON PO) Take 1 tablet by mouth.   Yes [provider]  FLUoxetine  (PROZAC ) 20 MG capsule TAKE 1 CAPSULE(20 MG) BY MOUTH DAILY 08/21/19  Yes Nyle Tinnie Fuller, NP  gabapentin  (NEURONTIN ) 100 MG capsule Take 100 mg by mouth. 07/29/20  Yes [provider]  hydrOXYzine (ATARAX) 25 MG tablet Take 25 mg by mouth every 8 (eight) hours as needed for anxiety. 08/28/24  Yes [provider]  levocetirizine (XYZAL) 5 MG tablet Take 5 mg by mouth every evening. 04/20/21  Yes [provider]  loratadine (CLARITIN) 10 MG tablet Take 10 mg by  mouth daily.   Yes [provider]  Magnesium Gluconate 550 MG TABS Take 30 mg by mouth.   Yes [provider]  magnesium oxide (MAG-OX) 400 MG tablet Take by mouth. 05/08/20  Yes [provider]  Melatonin 3 MG TABS Take by mouth.   Yes [provider]  metFORMIN (GLUCOPHAGE-XR) 500 MG 24 hr tablet Take 1 tablet twice a day by oral route. 02/16/23  Yes [provider]  methocarbamol  (ROBAXIN ) 750 MG tablet Take 750 mg by mouth 3 (three) times daily.   Yes [provider]  Metoprolol Succinate 25 MG CS24 Take 1 capsule every day by oral route.   Yes [provider]  metoprolol tartrate (LOPRESSOR) 25 MG tablet Take 12.5 mg by mouth in the morning, at noon, and at bedtime. 10/03/24 11/07/25 Yes [provider]  midodrine  (PROAMATINE ) 5 MG tablet Take 1 tablet (5 mg total) by mouth 3 (three) times daily with meals. 04/21/18  Yes Nyle Tinnie Fuller, NP  modafinil (PROVIGIL) 200 MG tablet Take 200 mg by mouth daily.   Yes [provider]  Multiple Vitamin (MULTIVITAMIN) capsule Take 1 capsule by mouth daily.   Yes [provider]  naltrexone  (DEPADE) 50 MG tablet TAKE 1/2 TABLET BY MOUTH 2 TIMES A DAY WITH BUPROPION  12/10/19  Yes Karamalegos, Marsa PARAS, DO  olopatadine (PATANOL) 0.1 % ophthalmic solution Apply to eye. 08/28/21  Yes [provider]  omeprazole  (PRILOSEC) 20 MG capsule Take 1 capsule (20 mg total) by mouth daily. 05/22/18  Yes Nyle Tinnie Fuller, NP  ondansetron (ZOFRAN-ODT) 4 MG disintegrating tablet Take 4 mg by mouth every 8 (eight) hours as needed for nausea, vomiting or refractory nausea / vomiting.   Yes [provider]  Potassium 99 MG TABS Take 99 mg by mouth.   Yes [provider]  progesterone (PROMETRIUM) 100 MG capsule Take 1 capsule by mouth daily.   Yes [provider]  senna-docusate (SENOKOT-S) 8.6-50 MG tablet Take by mouth. 09/10/21  Yes [provider]  tirzepatide CLOYDE) 2.5 MG/0.5ML Pen Inject 2.5 mg into the skin once a week.   Yes [provider]  tiZANidine (ZANAFLEX) 4 MG tablet Take 4 mg by mouth 3 (three) times daily.   Yes [provider]   Allergies  Allergen Reactions   Dexamethasone Other (See Comments) and Anxiety    Hallucinations  Other Reaction(s): Psychosis, Psychosis (intolerance)  Hallucinations  Steroid psychosis  Had psychosis with oral dexamethasone.   Can tolerate joint injections with less severe side effects.  Hallucinations, Steroid psychosis  Hallucinations    Hallucinations Steroid psychosis    Hallucinations, Steroid psychosis  Steroid psychosis  Suicidal ideation  Hallucinations  Hallucinations  Steroid psychosis  Steroid psychosis   Erythromycin Hives, Other (See Comments) and Shortness Of Breath    Other Reaction(s): Not available   Pantoprazole Other (See Comments) and Swelling    Other Reaction(s): Not available   Aspirin Itching, Other (See Comments), Rash and Dermatitis    Reaction: Purpura (intolerance)  Reaction: Purpura (intolerance)     Reaction: Purpura (intolerance)  petichea  Reaction: Purpura (intolerance)   Reaction: Purpura (intolerance)    Reaction: Purpura (intolerance)     Reaction: Purpura (intolerance)  aluminum aspirin   Penicillins Rash, Dermatitis and Other (See Comments)    Other Reaction(s): Not available, Other (See Comments)    FAMILY HISTORY:  family history includes Atrial fibrillation in her maternal grandmother; BRCA 1/2 in her mother and sister; Breast cancer (age of onset: 27) in her sister; Breast cancer (age of onset: 22) in her mother; Cancer in her mother and sister; Lung cancer in her paternal grandmother; Lupus in her father; Nephrotic syndrome in her father; Stroke in her maternal grandmother. SOCIAL HISTORY:  reports that she has never smoked. She has never used smokeless tobacco. She reports that  she does not currently use alcohol. She reports that she does not use drugs.   Review of Systems:  Gen:  Denies  fever, sweats, chills weight loss  HEENT: Denies blurred vision, double vision, ear pain, eye pain, hearing loss, nose bleeds, sore throat Cardiac:  No dizziness, chest pain or heaviness, chest tightness,edema, No JVD Resp:   No cough, -sputum production, -shortness of breath,-wheezing, -hemoptysis,  Gi: Denies swallowing difficulty, stomach pain, nausea or vomiting, diarrhea, constipation, bowel incontinence Gu:  Denies bladder incontinence, burning urine Ext:   Denies Joint pain, stiffness or swelling Skin: Denies  skin rash, easy bruising or bleeding or hives Endoc:  Denies polyuria, polydipsia , polyphagia or weight change Psych:   Denies depression, insomnia or hallucinations  Other:  All other systems negative  VITAL SIGNS: BP 110/70   Pulse 67   Temp 97.7 F (36.5 C)   Ht 5' 8 (1.727 m)   Wt 240 lb 3.2 oz (109 kg)   SpO2 97%   BMI 36.52 kg/m    Physical Examination:   General Appearance: No distress  EYES PERRLA, EOM intact.   NECK Supple, No JVD Pulmonary: normal breath sounds, No wheezing.  CardiovascularNormal S1,S2.  No m/r/g.   Abdomen: Benign, Soft, non-tender. Skin:   warm, no rashes, no ecchymosis  Extremities: normal, no cyanosis, clubbing. Neuro:without focal findings,  speech normal  PSYCHIATRIC: Mood, affect within normal limits.   ASSESSMENT AND PLAN  OSA Will reassess apnea with HST and complete order for new CPAP device. Discussed the consequences of untreated sleep apnea. Advised not to drive drowsy for safety of patient and others. Will follow up to review HST results.     Obesity Counseled patient on diet and lifestyle modification.    MEDICATION ADJUSTMENTS/LABS AND TESTS ORDERED: Recommend Sleep Study   Patient  satisfied with Plan of action and management. All questions answered  Follow up to review HST results and  treatment plan.   I spent a total of 51 minutes reviewing chart data, face-to-face evaluation with the patient, counseling and coordination of care as detailed above.    Minh Jasper, M.D.  Sleep Medicine [facility]

## 2024-10-10 NOTE — Patient Instructions (Signed)
 SABRA

## 2024-10-22 ENCOUNTER — Encounter

## 2024-10-22 DIAGNOSIS — G4733 Obstructive sleep apnea (adult) (pediatric): Secondary | ICD-10-CM

## 2024-11-03 ENCOUNTER — Encounter: Payer: Self-pay | Admitting: Sleep Medicine

## 2024-11-05 DIAGNOSIS — R069 Unspecified abnormalities of breathing: Secondary | ICD-10-CM | POA: Diagnosis not present

## 2024-11-05 NOTE — Telephone Encounter (Signed)
 Dr. Jess has read the study today and I will get scanned into Epic as soon as possible. It may be after lunch

## 2024-11-05 NOTE — Telephone Encounter (Signed)
Sleep study has now been scanned into Epic

## 2024-11-07 ENCOUNTER — Ambulatory Visit: Payer: Self-pay

## 2024-11-07 DIAGNOSIS — G4733 Obstructive sleep apnea (adult) (pediatric): Secondary | ICD-10-CM

## 2024-11-07 NOTE — Telephone Encounter (Signed)
 Result note have been given in another encounter. Closing this one.
